# Patient Record
Sex: Female | Born: 1945 | Race: Black or African American | Hispanic: No | Marital: Married | Smoking: Never smoker
Health system: Southern US, Community
[De-identification: ages and names within clinical notes are randomized; demographics above are authoritative.]

## PROBLEM LIST (undated history)

## (undated) DIAGNOSIS — Z8719 Personal history of other diseases of the digestive system: Secondary | ICD-10-CM

## (undated) DIAGNOSIS — Z87448 Personal history of other diseases of urinary system: Secondary | ICD-10-CM

## (undated) DIAGNOSIS — Z8619 Personal history of other infectious and parasitic diseases: Secondary | ICD-10-CM

## (undated) DIAGNOSIS — J45909 Unspecified asthma, uncomplicated: Secondary | ICD-10-CM

## (undated) DIAGNOSIS — K219 Gastro-esophageal reflux disease without esophagitis: Secondary | ICD-10-CM

## (undated) DIAGNOSIS — Z860100 Personal history of colon polyps, unspecified: Secondary | ICD-10-CM

## (undated) DIAGNOSIS — R933 Abnormal findings on diagnostic imaging of other parts of digestive tract: Secondary | ICD-10-CM

## (undated) DIAGNOSIS — T8859XA Other complications of anesthesia, initial encounter: Secondary | ICD-10-CM

## (undated) DIAGNOSIS — M1990 Unspecified osteoarthritis, unspecified site: Secondary | ICD-10-CM

## (undated) DIAGNOSIS — R079 Chest pain, unspecified: Secondary | ICD-10-CM

## (undated) DIAGNOSIS — E7800 Pure hypercholesterolemia, unspecified: Secondary | ICD-10-CM

## (undated) DIAGNOSIS — H25019 Cortical age-related cataract, unspecified eye: Secondary | ICD-10-CM

## (undated) DIAGNOSIS — R0600 Dyspnea, unspecified: Secondary | ICD-10-CM

## (undated) DIAGNOSIS — Z889 Allergy status to unspecified drugs, medicaments and biological substances status: Secondary | ICD-10-CM

## (undated) DIAGNOSIS — R112 Nausea with vomiting, unspecified: Secondary | ICD-10-CM

## (undated) DIAGNOSIS — Z9889 Other specified postprocedural states: Secondary | ICD-10-CM

## (undated) DIAGNOSIS — G40909 Epilepsy, unspecified, not intractable, without status epilepticus: Secondary | ICD-10-CM

## (undated) DIAGNOSIS — R55 Syncope and collapse: Secondary | ICD-10-CM

## (undated) DIAGNOSIS — I10 Essential (primary) hypertension: Secondary | ICD-10-CM

## (undated) HISTORY — PX: OTHER SURGICAL HISTORY: SHX169

## (undated) HISTORY — PX: COLONOSCOPY: SHX174

## (undated) HISTORY — PX: ESOPHAGOGASTRODUODENOSCOPY: SHX1529

## (undated) HISTORY — PX: ABDOMINAL HYSTERECTOMY: SHX81

## (undated) HISTORY — PX: APPENDECTOMY: SHX54

---

## 2005-01-11 ENCOUNTER — Other Ambulatory Visit: Payer: Self-pay

## 2005-01-11 ENCOUNTER — Emergency Department: Payer: Self-pay | Admitting: Internal Medicine

## 2005-01-11 ENCOUNTER — Ambulatory Visit: Payer: Self-pay | Admitting: Family Medicine

## 2006-02-02 ENCOUNTER — Ambulatory Visit: Payer: Self-pay | Admitting: Family Medicine

## 2006-06-04 ENCOUNTER — Emergency Department: Payer: Self-pay | Admitting: Emergency Medicine

## 2007-02-06 ENCOUNTER — Ambulatory Visit: Payer: Self-pay | Admitting: Unknown Physician Specialty

## 2008-02-20 ENCOUNTER — Ambulatory Visit: Payer: Self-pay | Admitting: Internal Medicine

## 2009-03-05 ENCOUNTER — Ambulatory Visit: Payer: Self-pay | Admitting: Internal Medicine

## 2010-05-24 ENCOUNTER — Ambulatory Visit: Payer: Self-pay | Admitting: Nurse Practitioner

## 2011-07-14 ENCOUNTER — Ambulatory Visit: Payer: Self-pay | Admitting: Nurse Practitioner

## 2011-09-09 ENCOUNTER — Emergency Department: Payer: Self-pay | Admitting: Emergency Medicine

## 2011-09-09 LAB — URINALYSIS, COMPLETE
Glucose,UR: NEGATIVE mg/dL (ref 0–75)
Nitrite: NEGATIVE
Ph: 6 (ref 4.5–8.0)
RBC,UR: 92 /HPF (ref 0–5)
Transitional Epi: 1
WBC UR: 146 /HPF (ref 0–5)

## 2011-09-09 LAB — CBC
HGB: 12.7 g/dL (ref 12.0–16.0)
MCH: 28.6 pg (ref 26.0–34.0)
MCHC: 32.5 g/dL (ref 32.0–36.0)
MCV: 88 fL (ref 80–100)
Platelet: 158 10*3/uL (ref 150–440)
RBC: 4.45 10*6/uL (ref 3.80–5.20)
RDW: 12.5 % (ref 11.5–14.5)
WBC: 4.5 10*3/uL (ref 3.6–11.0)

## 2011-09-09 LAB — COMPREHENSIVE METABOLIC PANEL
Anion Gap: 11 (ref 7–16)
BUN: 17 mg/dL (ref 7–18)
Calcium, Total: 8.7 mg/dL (ref 8.5–10.1)
Chloride: 107 mmol/L (ref 98–107)
Co2: 26 mmol/L (ref 21–32)
Creatinine: 0.78 mg/dL (ref 0.60–1.30)
EGFR (African American): >60
EGFR (Non-African Amer.): >60
Potassium: 3.5 mmol/L (ref 3.5–5.1)
SGOT(AST): 27 U/L (ref 15–37)
SGPT (ALT): 32 U/L
Total Protein: 7.6 g/dL (ref 6.4–8.2)

## 2012-06-16 ENCOUNTER — Emergency Department: Payer: Self-pay | Admitting: Emergency Medicine

## 2012-07-16 ENCOUNTER — Ambulatory Visit: Payer: Self-pay

## 2012-11-15 ENCOUNTER — Ambulatory Visit: Payer: Self-pay | Admitting: Unknown Physician Specialty

## 2013-01-16 ENCOUNTER — Ambulatory Visit: Payer: Self-pay | Admitting: General Practice

## 2013-02-05 ENCOUNTER — Ambulatory Visit: Payer: Self-pay | Admitting: General Practice

## 2013-02-05 LAB — CREATININE, SERUM: Creatinine: 0.72 mg/dL (ref 0.60–1.30)

## 2013-07-29 ENCOUNTER — Ambulatory Visit: Payer: Self-pay | Admitting: Unknown Physician Specialty

## 2013-08-28 ENCOUNTER — Ambulatory Visit: Payer: Self-pay | Admitting: Internal Medicine

## 2013-09-26 ENCOUNTER — Ambulatory Visit: Payer: Self-pay | Admitting: Unknown Physician Specialty

## 2013-10-01 LAB — PATHOLOGY REPORT

## 2014-04-02 ENCOUNTER — Ambulatory Visit: Payer: Self-pay | Admitting: Unknown Physician Specialty

## 2014-09-04 ENCOUNTER — Ambulatory Visit: Payer: Self-pay | Admitting: Internal Medicine

## 2015-06-08 ENCOUNTER — Other Ambulatory Visit: Payer: Self-pay | Admitting: Pediatrics

## 2015-06-08 DIAGNOSIS — Z1231 Encounter for screening mammogram for malignant neoplasm of breast: Secondary | ICD-10-CM

## 2015-09-07 ENCOUNTER — Ambulatory Visit
Admission: RE | Admit: 2015-09-07 | Discharge: 2015-09-07 | Disposition: A | Discharge status: Home / Self Care | Payer: Medicare Other | Source: Ambulatory Visit | Attending: Pediatrics | Admitting: Pediatrics

## 2015-09-07 ENCOUNTER — Other Ambulatory Visit: Payer: Self-pay | Admitting: Pediatrics

## 2015-09-07 DIAGNOSIS — Z1231 Encounter for screening mammogram for malignant neoplasm of breast: Secondary | ICD-10-CM

## 2016-07-12 ENCOUNTER — Other Ambulatory Visit: Payer: Self-pay | Admitting: Nurse Practitioner

## 2016-07-12 DIAGNOSIS — R079 Chest pain, unspecified: Secondary | ICD-10-CM

## 2016-07-18 ENCOUNTER — Other Ambulatory Visit: Payer: PPO

## 2016-07-18 ENCOUNTER — Ambulatory Visit: Payer: PPO

## 2016-07-21 ENCOUNTER — Ambulatory Visit
Admission: RE | Admit: 2016-07-21 | Discharge: 2016-07-21 | Disposition: A | Discharge status: Home / Self Care | Payer: Medicare Other | Source: Ambulatory Visit | Attending: Nurse Practitioner | Admitting: Nurse Practitioner

## 2016-07-21 DIAGNOSIS — R079 Chest pain, unspecified: Secondary | ICD-10-CM

## 2016-07-21 DIAGNOSIS — R0789 Other chest pain: Secondary | ICD-10-CM | POA: Insufficient documentation

## 2016-07-21 DIAGNOSIS — K449 Diaphragmatic hernia without obstruction or gangrene: Secondary | ICD-10-CM | POA: Insufficient documentation

## 2016-08-01 ENCOUNTER — Other Ambulatory Visit: Payer: Self-pay | Admitting: Family

## 2016-08-01 DIAGNOSIS — Z1231 Encounter for screening mammogram for malignant neoplasm of breast: Secondary | ICD-10-CM

## 2016-09-13 ENCOUNTER — Ambulatory Visit
Admission: RE | Admit: 2016-09-13 | Discharge: 2016-09-13 | Disposition: A | Discharge status: Home / Self Care | Payer: Medicare Other | Source: Ambulatory Visit | Attending: Family | Admitting: Family

## 2016-09-13 ENCOUNTER — Encounter: Payer: Self-pay | Admitting: Radiology

## 2016-09-13 DIAGNOSIS — Z1231 Encounter for screening mammogram for malignant neoplasm of breast: Secondary | ICD-10-CM | POA: Insufficient documentation

## 2017-08-25 ENCOUNTER — Other Ambulatory Visit: Payer: Self-pay | Admitting: Family Medicine

## 2017-09-05 ENCOUNTER — Other Ambulatory Visit: Payer: Self-pay | Admitting: Family

## 2017-09-05 DIAGNOSIS — Z1231 Encounter for screening mammogram for malignant neoplasm of breast: Secondary | ICD-10-CM

## 2017-09-20 ENCOUNTER — Ambulatory Visit
Admission: RE | Admit: 2017-09-20 | Discharge: 2017-09-20 | Disposition: A | Discharge status: Home / Self Care | Payer: Medicare Other | Source: Ambulatory Visit | Attending: Family | Admitting: Family

## 2017-09-20 DIAGNOSIS — Z1231 Encounter for screening mammogram for malignant neoplasm of breast: Secondary | ICD-10-CM | POA: Diagnosis not present

## 2017-11-06 ENCOUNTER — Other Ambulatory Visit: Payer: Self-pay | Admitting: Unknown Physician Specialty

## 2017-11-06 DIAGNOSIS — M1712 Unilateral primary osteoarthritis, left knee: Secondary | ICD-10-CM

## 2017-11-10 ENCOUNTER — Ambulatory Visit
Admission: RE | Admit: 2017-11-10 | Discharge: 2017-11-10 | Disposition: A | Discharge status: Home / Self Care | Payer: Medicare Other | Source: Ambulatory Visit | Attending: Unknown Physician Specialty | Admitting: Unknown Physician Specialty

## 2017-11-10 DIAGNOSIS — S83232A Complex tear of medial meniscus, current injury, left knee, initial encounter: Secondary | ICD-10-CM | POA: Diagnosis not present

## 2017-11-10 DIAGNOSIS — M948X6 Other specified disorders of cartilage, lower leg: Secondary | ICD-10-CM | POA: Insufficient documentation

## 2017-11-10 DIAGNOSIS — M1712 Unilateral primary osteoarthritis, left knee: Secondary | ICD-10-CM | POA: Insufficient documentation

## 2017-11-10 DIAGNOSIS — X58XXXA Exposure to other specified factors, initial encounter: Secondary | ICD-10-CM | POA: Insufficient documentation

## 2018-03-26 HISTORY — PX: JOINT REPLACEMENT: SHX530

## 2018-06-22 ENCOUNTER — Encounter: Payer: Self-pay | Admitting: *Deleted

## 2018-06-25 ENCOUNTER — Encounter
Admission: RE | Disposition: A | Discharge status: Home / Self Care | Payer: Self-pay | Source: Home / Self Care | Attending: Unknown Physician Specialty

## 2018-06-25 ENCOUNTER — Ambulatory Visit: Payer: Medicare Other | Admitting: Certified Registered Nurse Anesthetist

## 2018-06-25 ENCOUNTER — Encounter: Payer: Self-pay | Admitting: *Deleted

## 2018-06-25 ENCOUNTER — Ambulatory Visit
Admission: RE | Admit: 2018-06-25 | Discharge: 2018-06-25 | Disposition: A | Discharge status: Home / Self Care | Payer: Medicare Other | Attending: Unknown Physician Specialty | Admitting: Unknown Physician Specialty

## 2018-06-25 ENCOUNTER — Other Ambulatory Visit: Payer: Self-pay

## 2018-06-25 DIAGNOSIS — Z1211 Encounter for screening for malignant neoplasm of colon: Secondary | ICD-10-CM | POA: Diagnosis not present

## 2018-06-25 DIAGNOSIS — Z79899 Other long term (current) drug therapy: Secondary | ICD-10-CM | POA: Diagnosis not present

## 2018-06-25 DIAGNOSIS — Z86010 Personal history of colonic polyps: Secondary | ICD-10-CM | POA: Insufficient documentation

## 2018-06-25 DIAGNOSIS — R12 Heartburn: Secondary | ICD-10-CM | POA: Diagnosis not present

## 2018-06-25 DIAGNOSIS — I10 Essential (primary) hypertension: Secondary | ICD-10-CM | POA: Diagnosis not present

## 2018-06-25 DIAGNOSIS — K2970 Gastritis, unspecified, without bleeding: Secondary | ICD-10-CM | POA: Insufficient documentation

## 2018-06-25 DIAGNOSIS — Z7982 Long term (current) use of aspirin: Secondary | ICD-10-CM | POA: Diagnosis not present

## 2018-06-25 DIAGNOSIS — J45909 Unspecified asthma, uncomplicated: Secondary | ICD-10-CM | POA: Diagnosis not present

## 2018-06-25 DIAGNOSIS — K640 First degree hemorrhoids: Secondary | ICD-10-CM | POA: Diagnosis not present

## 2018-06-25 DIAGNOSIS — E7800 Pure hypercholesterolemia, unspecified: Secondary | ICD-10-CM | POA: Insufficient documentation

## 2018-06-25 DIAGNOSIS — D120 Benign neoplasm of cecum: Secondary | ICD-10-CM | POA: Diagnosis not present

## 2018-06-25 DIAGNOSIS — K449 Diaphragmatic hernia without obstruction or gangrene: Secondary | ICD-10-CM | POA: Diagnosis not present

## 2018-06-25 HISTORY — DX: Personal history of colon polyps, unspecified: Z86.0100

## 2018-06-25 HISTORY — DX: Personal history of other infectious and parasitic diseases: Z86.19

## 2018-06-25 HISTORY — DX: Pure hypercholesterolemia, unspecified: E78.00

## 2018-06-25 HISTORY — DX: Personal history of other diseases of the digestive system: Z87.19

## 2018-06-25 HISTORY — DX: Personal history of colonic polyps: Z86.010

## 2018-06-25 HISTORY — DX: Allergy status to unspecified drugs, medicaments and biological substances: Z88.9

## 2018-06-25 HISTORY — DX: Chest pain, unspecified: R07.9

## 2018-06-25 HISTORY — DX: Syncope and collapse: R55

## 2018-06-25 HISTORY — DX: Abnormal findings on diagnostic imaging of other parts of digestive tract: R93.3

## 2018-06-25 HISTORY — DX: Epilepsy, unspecified, not intractable, without status epilepticus: G40.909

## 2018-06-25 HISTORY — PX: ESOPHAGOGASTRODUODENOSCOPY (EGD) WITH PROPOFOL: SHX5813

## 2018-06-25 HISTORY — PX: COLONOSCOPY WITH PROPOFOL: SHX5780

## 2018-06-25 HISTORY — DX: Unspecified asthma, uncomplicated: J45.909

## 2018-06-25 HISTORY — DX: Personal history of other diseases of urinary system: Z87.448

## 2018-06-25 HISTORY — DX: Essential (primary) hypertension: I10

## 2018-06-25 HISTORY — DX: Cortical age-related cataract, unspecified eye: H25.019

## 2018-06-25 HISTORY — DX: Unspecified osteoarthritis, unspecified site: M19.90

## 2018-06-25 SURGERY — COLONOSCOPY WITH PROPOFOL
Anesthesia: General

## 2018-06-25 MED ORDER — PIPERACILLIN-TAZOBACTAM 3.375 G IVPB 30 MIN
3.3750 g | Freq: Once | INTRAVENOUS | Status: AC
  Administered 2018-06-25: 3.375 g via INTRAVENOUS
  Filled 2018-06-25: qty 50

## 2018-06-25 MED ORDER — GLYCOPYRROLATE 0.2 MG/ML IJ SOLN
INTRAMUSCULAR | Status: DC | PRN
  Administered 2018-06-25: 0.2 mg via INTRAVENOUS

## 2018-06-25 MED ORDER — LIDOCAINE HCL (PF) 2 % IJ SOLN
INTRAMUSCULAR | Status: AC
  Filled 2018-06-25: qty 10

## 2018-06-25 MED ORDER — PIPERACILLIN-TAZOBACTAM 3.375 G IVPB
INTRAVENOUS | Status: AC
  Administered 2018-06-25: 3.375 g via INTRAVENOUS
  Filled 2018-06-25: qty 50

## 2018-06-25 MED ORDER — SODIUM CHLORIDE 0.9 % IV SOLN
INTRAVENOUS | Status: DC
  Administered 2018-06-25: 12:00:00 via INTRAVENOUS

## 2018-06-25 MED ORDER — LIDOCAINE HCL (CARDIAC) PF 100 MG/5ML IV SOSY
PREFILLED_SYRINGE | INTRAVENOUS | Status: DC | PRN
  Administered 2018-06-25: 100 mg via INTRATRACHEAL

## 2018-06-25 MED ORDER — PROPOFOL 500 MG/50ML IV EMUL
INTRAVENOUS | Status: DC | PRN
  Administered 2018-06-25: 80 ug/kg/min via INTRAVENOUS

## 2018-06-25 MED ORDER — GLYCOPYRROLATE 0.2 MG/ML IJ SOLN
INTRAMUSCULAR | Status: AC
  Filled 2018-06-25: qty 1

## 2018-06-25 MED ORDER — PROPOFOL 500 MG/50ML IV EMUL
INTRAVENOUS | Status: AC
  Filled 2018-06-25: qty 50

## 2018-06-25 MED ORDER — PROPOFOL 10 MG/ML IV BOLUS
INTRAVENOUS | Status: DC | PRN
  Administered 2018-06-25: 80 mg via INTRAVENOUS

## 2018-06-25 NOTE — Anesthesia Preprocedure Evaluation (Signed)
Anesthesia Evaluation  Patient identified by MRN, date of birth, ID band Patient awake    Reviewed: Allergy & Precautions, H&P , NPO status , Patient's Chart, lab work & pertinent test results, reviewed documented beta blocker date and time   History of Anesthesia Complications Negative for: history of anesthetic complications  Airway Mallampati: III  TM Distance: >3 FB Neck ROM: full    Dental  (+) Partial Upper, Dental Advidsory Given, Chipped   Pulmonary neg shortness of breath, asthma , neg sleep apnea, neg COPD, neg recent URI,           Cardiovascular Exercise Tolerance: Good hypertension, (-) angina(-) CAD, (-) Past MI, (-) Cardiac Stents and (-) CABG Normal cardiovascular exam(-) dysrhythmias (-) Valvular Problems/Murmurs Rhythm:regular Rate:Normal     Neuro/Psych Seizures -, Well Controlled,  negative psych ROS   GI/Hepatic Neg liver ROS, hiatal hernia, GERD  ,  Endo/Other  negative endocrine ROS  Renal/GU negative Renal ROS  negative genitourinary   Musculoskeletal   Abdominal   Peds  Hematology negative hematology ROS (+)   Anesthesia Other Findings Past Medical History: No date: Abnormal barium swallow No date: Allergic genetic state No date: Arthritis     Comment:  OSTEOARTHRITIS No date: Asthma No date: Cataract cortical, senile No date: Chest pain No date: Epilepsy ([place]) No date: History of colon polyps No date: History of Helicobacter pylori infection No date: History of hematuria No date: History of hiatal hernia No date: Hypercholesterolemia No date: Hypertension No date: Syncopal episodes   Reproductive/Obstetrics negative OB ROS                             Anesthesia Physical Anesthesia Plan  ASA: III  Anesthesia Plan: General   Post-op Pain Management:    Induction: Intravenous  PONV Risk Score and Plan: 3 and Propofol infusion and TIVA  Airway  Management Planned: Natural Airway and Nasal Cannula  Additional Equipment:   Intra-op Plan:   Post-operative Plan:   Informed Consent: I have reviewed the patients History and Physical, chart, labs and discussed the procedure including the risks, benefits and alternatives for the proposed anesthesia with the patient or authorized representative who has indicated his/her understanding and acceptance.   Dental Advisory Given  Plan Discussed with: Anesthesiologist, CRNA and Surgeon  Anesthesia Plan Comments:         Anesthesia Quick Evaluation

## 2018-06-25 NOTE — H&P (Signed)
Primary Care Physician:  Juanell Fairly, MD Primary Gastroenterologist:  Dr. Vira Agar  Pre-Procedure History & Physical: HPI:  Hannah Davila is a 72 y.o. female is here for an endoscopy and colonoscopy.   Past Medical History:  Diagnosis Date  . Abnormal barium swallow   . Allergic genetic state   . Arthritis    OSTEOARTHRITIS  . Asthma   . Cataract cortical, senile   . Chest pain   . Epilepsy ([place])   . History of colon polyps   . History of Helicobacter pylori infection   . History of hematuria   . History of hiatal hernia   . Hypercholesterolemia   . Hypertension   . Syncopal episodes     Past Surgical History:  Procedure Laterality Date  . ABDOMINAL HYSTERECTOMY    . COLONOSCOPY    . ESOPHAGOGASTRODUODENOSCOPY    . JOINT REPLACEMENT Left 03/26/2018  . REMOVAL WISDOM TEETH    . RT.ARM SURGERY     [facility] [place]    Prior to Admission medications   Medication Sig Start Date End Date Taking? Authorizing Provider  acetaminophen (TYLENOL) 500 MG tablet Take 500 mg by mouth every 6 (six) hours as needed.   Yes [provider]  albuterol (PROVENTIL HFA;VENTOLIN HFA) 108 (90 Base) MCG/ACT inhaler Inhale 2 puffs into the lungs every 6 (six) hours as needed for wheezing or shortness of breath.   Yes [provider]  aspirin EC 81 MG tablet Take 81 mg by mouth daily.   Yes [provider]  lisinopril (PRINIVIL,ZESTRIL) 20 MG tablet Take 20 mg by mouth daily.   Yes [provider]  metoprolol succinate (TOPROL-XL) 50 MG 24 hr tablet Take 50 mg by mouth daily. Take with or immediately following a meal.   Yes [provider]  Multiple Vitamin (MULTIVITAMIN) tablet Take 1 tablet by mouth daily.   Yes [provider]  omeprazole (PRILOSEC) 20 MG capsule Take 20 mg by mouth daily.   Yes [provider]  pravastatin (PRAVACHOL) 40 MG tablet Take 40 mg by mouth daily.   Yes [provider]     Allergies as of 04/25/2018  . (Not on File)    Family History  Problem Relation Age of Onset  . Breast cancer Sister     Social History   Socioeconomic History  . Marital status: Married    Spouse name: Not on file  . Number of children: Not on file  . Years of education: Not on file  . Highest education level: Not on file  Occupational History  . Not on file  Social Needs  . Financial resource strain: Not on file  . Food insecurity:    Worry: Not on file    Inability: Not on file  . Transportation needs:    Medical: Not on file    Non-medical: Not on file  Tobacco Use  . Smoking status: Never Smoker  . Smokeless tobacco: Never Used  Substance and Sexual Activity  . Alcohol use: Never    Frequency: Never  . Drug use: Never  . Sexual activity: Not on file  Lifestyle  . Physical activity:    Days per week: Not on file    Minutes per session: Not on file  . Stress: Not on file  Relationships  . Social connections:    Talks on phone: Not on file    Gets together: Not on file    Attends religious service: Not  on file    Active member of club or organization: Not on file    Attends meetings of clubs or organizations: Not on file    Relationship status: Not on file  . Intimate partner violence:    Fear of current or ex partner: Not on file    Emotionally abused: Not on file    Physically abused: Not on file    Forced sexual activity: Not on file  Other Topics Concern  . Not on file  Social History Narrative  . Not on file    Review of Systems: See HPI, otherwise negative ROS  Physical Exam: BP (!) 151/76   Pulse (!) 10   Temp 97.7 F (36.5 C) (Tympanic)   Resp 18   Ht 5\' 2"  (1.575 m)   Wt 83 kg   SpO2 100%   BMI 33.47 kg/m  General:   Alert,  pleasant and cooperative in NAD Head:  Normocephalic and atraumatic. Neck:  Supple; no masses or thyromegaly. Lungs:  Clear throughout to auscultation.    Heart:  Regular rate and rhythm. Abdomen:   Soft, nontender and nondistended. Normal bowel sounds, without guarding, and without rebound.   Neurologic:  Alert and  oriented x4;  grossly normal neurologically.  Impression/Plan: Hannah Davila is here for an endoscopy and colonoscopy to be performed for [facility] colon polyps and GERD   Risks, benefits, limitations, and alternatives regarding  endoscopy and colonoscopy   have been reviewed with the patient.  Questions have been answered.  All parties agreeable.   Gaylyn Cheers, MD  06/25/2018, 12:42 PM

## 2018-06-25 NOTE — Anesthesia Post-op Follow-up Note (Signed)
Anesthesia QCDR form completed.        

## 2018-06-25 NOTE — Transfer of Care (Signed)
Immediate Anesthesia Transfer of Care Note  Patient: Hannah Davila  Procedure(s) Performed: COLONOSCOPY WITH PROPOFOL (N/A ) ESOPHAGOGASTRODUODENOSCOPY (EGD) WITH PROPOFOL (N/A )  Patient Location: PACU and Endoscopy Unit  Anesthesia Type:General  Level of Consciousness: sedated  Airway & Oxygen Therapy: Patient Spontanous Breathing and Patient connected to nasal cannula oxygen  Post-op Assessment: Report given to RN and Post -op Vital signs reviewed and stable  Post vital signs: Reviewed and stable  Last Vitals:  Vitals Value Taken Time  BP 123/74 06/25/2018  1:34 PM  Temp 36.2 C 06/25/2018  1:34 PM  Pulse 81 06/25/2018  1:34 PM  Resp 21 06/25/2018  1:34 PM  SpO2 100 % 06/25/2018  1:34 PM  Vitals shown include unvalidated device data.  Last Pain:  Vitals:   06/25/18 1334  TempSrc:   PainSc: Asleep         Complications: No apparent anesthesia complications

## 2018-06-25 NOTE — Op Note (Signed)
[facility] Gastroenterology Patient Name: Rubena Roseman Procedure Date: 06/25/2018 12:47 PM MRN: [id number] Account #: [id number] Date of Birth: 1946-04-11 Admit Type: Outpatient Age: 72 Room: [facility] ENDO ROOM 1 Gender: Female Note Status: Finalized Procedure:            Upper GI endoscopy Indications:          Heartburn Providers:            Manya Silvas, MD Referring MD:         No Local Md, MD (Referring MD) Medicines:            Propofol per Anesthesia Complications:        No immediate complications. Procedure:            Pre-Anesthesia Assessment:                       - After reviewing the risks and benefits, the patient                        was deemed in satisfactory condition to undergo the                        procedure.                       After obtaining informed consent, the endoscope was                        passed under direct vision. Throughout the procedure,                        the patient's blood pressure, pulse, and oxygen                        saturations were monitored continuously. The Endoscope                        was introduced through the mouth, and advanced to the                        second part of duodenum. The upper GI endoscopy was                        accomplished without difficulty. The patient tolerated                        the procedure well. Findings:      A medium-sized hiatal hernia was present. GEJ 36cm.      Patchy mildly erythematous mucosa without bleeding was found in the       stomach. Biopsies were taken with a cold forceps for histology.      The examined duodenum was normal. Impression:           - Medium-sized hiatal hernia.                       - Erythematous mucosa in the stomach. Biopsied.                       - Normal examined duodenum. Recommendation:       - Await pathology results. Manya Silvas, MD 06/25/2018  1:00:56 PM This report has been signed electronically. Number of  Addenda: 0 Note Initiated On: 06/25/2018 12:47 PM      [facility]

## 2018-06-25 NOTE — Op Note (Signed)
[facility] Gastroenterology Patient Name: Gelena Klosinski Procedure Date: 06/25/2018 12:46 PM MRN: [id number] Account #: [id number] Date of Birth: [birth year] Admit Type: Outpatient Age: 72 Room: [facility] ENDO ROOM 1 Gender: Female Note Status: Finalized Procedure:            Colonoscopy Indications:          High risk colon cancer surveillance: Personal history                        of colonic polyps Providers:            Manya Silvas, MD Referring MD:         No Local Md, MD (Referring MD) Medicines:            Propofol per Anesthesia Complications:        No immediate complications. Procedure:            Pre-Anesthesia Assessment:                       - After reviewing the risks and benefits, the patient                        was deemed in satisfactory condition to undergo the                        procedure.                       After obtaining informed consent, the colonoscope was                        passed under direct vision. Throughout the procedure,                        the patient's blood pressure, pulse, and oxygen                        saturations were monitored continuously. The                        Colonoscope was introduced through the anus and                        advanced to the the cecum, identified by appendiceal                        orifice and ileocecal valve. The colonoscopy was                        performed without difficulty. The patient tolerated the                        procedure well. The quality of the bowel preparation                        was good. Findings:      A diminutive polyp was found in the cecum. The polyp was sessile. The       polyp was removed with a jumbo cold forceps. Resection and retrieval       were complete.      A small polyp was found  in the cecum. The polyp was sessile. The polyp       was removed with a cold snare. Resection and retrieval were complete.      Internal hemorrhoids were  found during endoscopy. The hemorrhoids were       small and Grade I (internal hemorrhoids that do not prolapse).      The exam was otherwise without abnormality. Impression:           - One diminutive polyp in the cecum, removed with a                        jumbo cold forceps. Resected and retrieved.                       - One small polyp in the cecum, removed with a cold                        snare. Resected and retrieved.                       - Internal hemorrhoids.                       - The examination was otherwise normal. Recommendation:       - Await pathology results. Manya Silvas, MD 06/25/2018 1:32:55 PM This report has been signed electronically. Number of Addenda: 0 Note Initiated On: 06/25/2018 12:46 PM Scope Withdrawal Time: 0 hours 15 minutes 38 seconds  Total Procedure Duration: 0 hours 26 minutes 23 seconds       [facility]

## 2018-06-26 ENCOUNTER — Encounter: Payer: Self-pay | Admitting: Unknown Physician Specialty

## 2018-06-26 LAB — SURGICAL PATHOLOGY

## 2018-06-29 NOTE — Anesthesia Postprocedure Evaluation (Signed)
Anesthesia Post Note  Patient: Hannah Davila  Procedure(s) Performed: COLONOSCOPY WITH PROPOFOL (N/A ) ESOPHAGOGASTRODUODENOSCOPY (EGD) WITH PROPOFOL (N/A )  Patient location during evaluation: Endoscopy Anesthesia Type: General Level of consciousness: awake and alert Pain management: pain level controlled Vital Signs Assessment: post-procedure vital signs reviewed and stable Respiratory status: spontaneous breathing, nonlabored ventilation, respiratory function stable and patient connected to nasal cannula oxygen Cardiovascular status: blood pressure returned to baseline and stable Postop Assessment: no apparent nausea or vomiting Anesthetic complications: no     Last Vitals:  Vitals:   06/25/18 1345 06/25/18 1354  BP: 126/68 125/89  Pulse: 72 72  Resp: 16 15  Temp:    SpO2: 100% 100%    Last Pain:  Vitals:   06/26/18 0758  TempSrc:   PainSc: 0-No pain                 Martha Clan

## 2018-08-15 ENCOUNTER — Other Ambulatory Visit: Payer: Self-pay | Admitting: Family

## 2018-08-15 DIAGNOSIS — Z1231 Encounter for screening mammogram for malignant neoplasm of breast: Secondary | ICD-10-CM

## 2018-12-25 ENCOUNTER — Other Ambulatory Visit: Payer: Self-pay

## 2018-12-25 ENCOUNTER — Ambulatory Visit
Admission: RE | Admit: 2018-12-25 | Discharge: 2018-12-25 | Disposition: A | Discharge status: Home / Self Care | Payer: Medicare Other | Source: Ambulatory Visit | Attending: Family | Admitting: Family

## 2018-12-25 DIAGNOSIS — Z1231 Encounter for screening mammogram for malignant neoplasm of breast: Secondary | ICD-10-CM | POA: Insufficient documentation

## 2019-07-09 ENCOUNTER — Other Ambulatory Visit: Payer: Self-pay | Admitting: Family

## 2019-07-09 DIAGNOSIS — Z1231 Encounter for screening mammogram for malignant neoplasm of breast: Secondary | ICD-10-CM

## 2019-12-26 ENCOUNTER — Ambulatory Visit
Admission: RE | Admit: 2019-12-26 | Discharge: 2019-12-26 | Disposition: A | Discharge status: Home / Self Care | Payer: Medicare PPO | Source: Ambulatory Visit | Attending: Family | Admitting: Family

## 2019-12-26 DIAGNOSIS — Z1231 Encounter for screening mammogram for malignant neoplasm of breast: Secondary | ICD-10-CM | POA: Insufficient documentation

## 2020-06-11 ENCOUNTER — Other Ambulatory Visit: Payer: Self-pay | Admitting: Surgery

## 2020-06-23 ENCOUNTER — Other Ambulatory Visit: Payer: Self-pay

## 2020-06-23 ENCOUNTER — Encounter
Admission: RE | Admit: 2020-06-23 | Discharge: 2020-06-23 | Disposition: A | Discharge status: Home / Self Care | Payer: Medicare PPO | Source: Ambulatory Visit | Attending: Surgery | Admitting: Surgery

## 2020-06-23 DIAGNOSIS — Z01818 Encounter for other preprocedural examination: Secondary | ICD-10-CM | POA: Insufficient documentation

## 2020-06-23 HISTORY — DX: Other specified postprocedural states: Z98.890

## 2020-06-23 HISTORY — DX: Other specified postprocedural states: R11.2

## 2020-06-23 HISTORY — DX: Other complications of anesthesia, initial encounter: T88.59XA

## 2020-06-23 HISTORY — DX: Dyspnea, unspecified: R06.00

## 2020-06-23 HISTORY — DX: Gastro-esophageal reflux disease without esophagitis: K21.9

## 2020-06-23 LAB — URINALYSIS, ROUTINE W REFLEX MICROSCOPIC
Bilirubin Urine: NEGATIVE
Glucose, UA: NEGATIVE mg/dL
Hgb urine dipstick: NEGATIVE
Ketones, ur: NEGATIVE mg/dL
Leukocytes,Ua: NEGATIVE
Nitrite: NEGATIVE
Protein, ur: NEGATIVE mg/dL
Specific Gravity, Urine: 1.018 (ref 1.005–1.030)
pH: 6 (ref 5.0–8.0)

## 2020-06-23 LAB — CBC WITH DIFFERENTIAL/PLATELET
Abs Immature Granulocytes: 0.02 10*3/uL (ref 0.00–0.07)
Basophils Absolute: 0 10*3/uL (ref 0.0–0.1)
Basophils Relative: 1 %
Eosinophils Absolute: 0.1 10*3/uL (ref 0.0–0.5)
Eosinophils Relative: 1 %
HCT: 37.8 % (ref 36.0–46.0)
Hemoglobin: 12 g/dL (ref 12.0–15.0)
Immature Granulocytes: 1 %
Lymphocytes Relative: 39 %
Lymphs Abs: 1.7 10*3/uL (ref 0.7–4.0)
MCH: 28.7 pg (ref 26.0–34.0)
MCHC: 31.7 g/dL (ref 30.0–36.0)
MCV: 90.4 fL (ref 80.0–100.0)
Monocytes Absolute: 0.3 10*3/uL (ref 0.1–1.0)
Monocytes Relative: 7 %
Neutro Abs: 2.3 10*3/uL (ref 1.7–7.7)
Neutrophils Relative %: 51 %
Platelets: 164 10*3/uL (ref 150–400)
RBC: 4.18 MIL/uL (ref 3.87–5.11)
RDW: 12.2 % (ref 11.5–15.5)
WBC: 4.4 10*3/uL (ref 4.0–10.5)
nRBC: 0 % (ref 0.0–0.2)

## 2020-06-23 LAB — COMPREHENSIVE METABOLIC PANEL
ALT: 21 U/L (ref 0–44)
AST: 21 U/L (ref 15–41)
Albumin: 4 g/dL (ref 3.5–5.0)
Alkaline Phosphatase: 83 U/L (ref 38–126)
Anion gap: 8 (ref 5–15)
BUN: 18 mg/dL (ref 8–23)
CO2: 27 mmol/L (ref 22–32)
Calcium: 8.7 mg/dL — ABNORMAL LOW (ref 8.9–10.3)
Chloride: 106 mmol/L (ref 98–111)
Creatinine, Ser: 0.74 mg/dL (ref 0.44–1.00)
GFR, Estimated: >60 mL/min (ref >60)
Glucose, Bld: 118 mg/dL — ABNORMAL HIGH (ref 70–99)
Potassium: 3.7 mmol/L (ref 3.5–5.1)
Sodium: 141 mmol/L (ref 135–145)
Total Bilirubin: 0.7 mg/dL (ref 0.3–1.2)
Total Protein: 7 g/dL (ref 6.5–8.1)

## 2020-06-23 LAB — TYPE AND SCREEN
ABO/RH(D): A POS
Antibody Screen: NEGATIVE

## 2020-06-23 LAB — SURGICAL PCR SCREEN
MRSA, PCR: NEGATIVE
Staphylococcus aureus: NEGATIVE

## 2020-06-23 NOTE — Patient Instructions (Signed)
Your procedure is scheduled on: Thursday July 02, 2020. Report to Day Surgery inside [facility] 2nd floor (stop by Registration desk first floor). To find out your arrival time please call [phone] between 1PM - 3PM on Wednesday July 01, 2020.  Remember: Instructions that are not followed completely may result in serious medical risk,  up to and including death, or upon the discretion of your surgeon and anesthesiologist your  surgery may need to be rescheduled.     _X__ 1. Do not eat food after midnight the night before your procedure.                 No chewing gum or hard candies. You may drink clear liquids up to 2 hours                 before you are scheduled to arrive for your surgery- DO not drink clear                 liquids within 2 hours of the start of your surgery.                 Clear Liquids include:  water, apple juice without pulp, clear Gatorade, G2 or                  Gatorade Zero (avoid Red/Purple/Blue), Black Coffee or Tea (Do not add                 anything to coffee or tea).  __X__2.   Complete the "Ensure Clear Pre-surgery Clear Carbohydrate Drink" provided to you, 2 hours before arrival. **If you are diabetic you will be provided with an alternative drink, Gatorade Zero or G2.  __X__3.  On the morning of surgery brush your teeth with toothpaste and water, you                may rinse your mouth with mouthwash if you wish.  Do not swallow any toothpaste of mouthwash.     _X__ 4.  No Alcohol for 24 hours before or after surgery.   _X__ 5.  Do Not Smoke or use e-cigarettes For 24 Hours Prior to Your Surgery.                 Do not use any chewable tobacco products for at least 6 hours prior to                 Surgery.  _X__  6.  Do not use any recreational drugs (marijuana, cocaine, heroin, ecstasy, MDMA or other)                For at least one week prior to your surgery.  Combination of these drugs with anesthesia                 May have life threatening results.  __X__  7.  Notify your doctor if there is any change in your medical condition      (cold, fever, infections).     Do not wear jewelry, make-up, hairpins, clips or nail polish. Do not wear lotions, powders, or perfumes. You may wear deodorant. Do not shave 48 hours prior to surgery. Men may shave face and neck. Do not bring valuables to the hospital.    [facility] is not responsible for any belongings or valuables.  Contacts, dentures or bridgework may not be worn into surgery. Leave your suitcase in the car. After surgery it may  be brought to your room. For patients admitted to the hospital, discharge time is determined by your treatment team.   Patients discharged the day of surgery will not be allowed to drive home.   Make arrangements for someone to be with you for the first 24 hours of your Same Day Discharge.    Please read over the following fact sheets that you were given:  Joint replacement book  Incentive Spirometry   __X__ Take these medicines the morning of surgery with A SIP OF WATER:    1. amLODipine (NORVASC) 5 MG   2. metoprolol tartrate (LOPRESSOR) 50 MG   3. omeprazole (PRILOSEC) 20 MG capsule  4. pravastatin (PRAVACHOL) 40 MG    ____ Fleet Enema (as directed)   __X__ Use CHG Soap (or wipes) as directed  ____ Use Benzoyl Peroxide Gel as instructed  __X__ Use inhalers on the day of surgery  albuterol (PROVENTIL HFA;VENTOLIN HFA) 108 (90 Base) MCG/ACT inhaler  ____ Stop metformin 2 days prior to surgery    ____ Take 1/2 of usual insulin dose the night before surgery. No insulin the morning          of surgery.   __X__ Stop aspirin as instructed by your doctor.   __X__ Stop Anti-inflammatories such as naproxen (NAPROSYN), Ibuprofen, Aleve, Advil and or BC powders.   __X__ Stop supplements until after surgery. Biotin w/ Vitamins C & E (HAIR/SKIN/NAILS PO)  __X__ Do not start any herbal supplements before your  procedure.    If you have any questions regarding your pre-procedure instructions,  Please call Pre-admit Testing at [phone].

## 2020-06-30 ENCOUNTER — Other Ambulatory Visit: Payer: Self-pay

## 2020-06-30 ENCOUNTER — Other Ambulatory Visit
Admission: RE | Admit: 2020-06-30 | Discharge: 2020-06-30 | Disposition: A | Discharge status: Home / Self Care | Payer: Medicare PPO | Source: Ambulatory Visit | Attending: Surgery | Admitting: Surgery

## 2020-06-30 DIAGNOSIS — Z20822 Contact with and (suspected) exposure to covid-19: Secondary | ICD-10-CM | POA: Insufficient documentation

## 2020-06-30 DIAGNOSIS — Z01812 Encounter for preprocedural laboratory examination: Secondary | ICD-10-CM | POA: Insufficient documentation

## 2020-06-30 LAB — SARS CORONAVIRUS 2 (TAT 6-24 HRS): SARS Coronavirus 2: NEGATIVE

## 2020-07-02 ENCOUNTER — Encounter: Payer: Self-pay | Admitting: Surgery

## 2020-07-02 ENCOUNTER — Other Ambulatory Visit: Payer: Self-pay

## 2020-07-02 ENCOUNTER — Inpatient Hospital Stay: Payer: Medicare PPO | Admitting: Anesthesiology

## 2020-07-02 ENCOUNTER — Inpatient Hospital Stay: Payer: Medicare PPO

## 2020-07-02 ENCOUNTER — Encounter
Admission: RE | Disposition: A | Discharge status: Home Health | Payer: Self-pay | Source: Home / Self Care | Attending: Surgery

## 2020-07-02 ENCOUNTER — Inpatient Hospital Stay
Admission: RE | Admit: 2020-07-02 | Discharge: 2020-07-03 | DRG: 470 | Disposition: A | Discharge status: Home Health | Payer: Medicare PPO | Attending: Surgery | Admitting: Surgery

## 2020-07-02 DIAGNOSIS — Z20822 Contact with and (suspected) exposure to covid-19: Secondary | ICD-10-CM | POA: Diagnosis present

## 2020-07-02 DIAGNOSIS — I10 Essential (primary) hypertension: Secondary | ICD-10-CM | POA: Diagnosis present

## 2020-07-02 DIAGNOSIS — Z7982 Long term (current) use of aspirin: Secondary | ICD-10-CM | POA: Diagnosis not present

## 2020-07-02 DIAGNOSIS — M1711 Unilateral primary osteoarthritis, right knee: Secondary | ICD-10-CM | POA: Diagnosis present

## 2020-07-02 DIAGNOSIS — J45909 Unspecified asthma, uncomplicated: Secondary | ICD-10-CM | POA: Diagnosis present

## 2020-07-02 DIAGNOSIS — Z96651 Presence of right artificial knee joint: Secondary | ICD-10-CM

## 2020-07-02 DIAGNOSIS — E785 Hyperlipidemia, unspecified: Secondary | ICD-10-CM | POA: Diagnosis present

## 2020-07-02 DIAGNOSIS — Z8249 Family history of ischemic heart disease and other diseases of the circulatory system: Secondary | ICD-10-CM | POA: Diagnosis not present

## 2020-07-02 DIAGNOSIS — Z79899 Other long term (current) drug therapy: Secondary | ICD-10-CM

## 2020-07-02 DIAGNOSIS — Z96652 Presence of left artificial knee joint: Secondary | ICD-10-CM | POA: Diagnosis present

## 2020-07-02 DIAGNOSIS — K219 Gastro-esophageal reflux disease without esophagitis: Secondary | ICD-10-CM | POA: Diagnosis present

## 2020-07-02 DIAGNOSIS — M170 Bilateral primary osteoarthritis of knee: Secondary | ICD-10-CM | POA: Diagnosis present

## 2020-07-02 DIAGNOSIS — G40909 Epilepsy, unspecified, not intractable, without status epilepticus: Secondary | ICD-10-CM | POA: Diagnosis present

## 2020-07-02 DIAGNOSIS — E7800 Pure hypercholesterolemia, unspecified: Secondary | ICD-10-CM | POA: Diagnosis present

## 2020-07-02 HISTORY — PX: TOTAL KNEE ARTHROPLASTY: SHX125

## 2020-07-02 LAB — ABO/RH: ABO/RH(D): A POS

## 2020-07-02 SURGERY — ARTHROPLASTY, KNEE, TOTAL
Anesthesia: Spinal | Site: Knee | Laterality: Right

## 2020-07-02 MED ORDER — ACETAMINOPHEN 10 MG/ML IV SOLN
INTRAVENOUS | Status: AC
  Filled 2020-07-02: qty 100

## 2020-07-02 MED ORDER — KETOROLAC TROMETHAMINE 15 MG/ML IJ SOLN
7.5000 mg | Freq: Four times a day (QID) | INTRAMUSCULAR | Status: AC
  Administered 2020-07-02: 7.5 mg via INTRAVENOUS

## 2020-07-02 MED ORDER — BUPIVACAINE LIPOSOME 1.3 % IJ SUSP
INTRAMUSCULAR | Status: AC
  Filled 2020-07-02: qty 20

## 2020-07-02 MED ORDER — METOPROLOL TARTRATE 50 MG PO TABS
ORAL_TABLET | ORAL | Status: AC
  Filled 2020-07-02: qty 1

## 2020-07-02 MED ORDER — TRAMADOL HCL 50 MG PO TABS
50.0000 mg | ORAL_TABLET | Freq: Four times a day (QID) | ORAL | Status: DC | PRN
  Administered 2020-07-03: 50 mg via ORAL

## 2020-07-02 MED ORDER — MORPHINE SULFATE (PF) 4 MG/ML IV SOLN: 1.0000 mg | INTRAVENOUS | Status: DC | PRN

## 2020-07-02 MED ORDER — PROPOFOL 10 MG/ML IV BOLUS
INTRAVENOUS | Status: DC | PRN
  Administered 2020-07-02: 40 mg via INTRAVENOUS

## 2020-07-02 MED ORDER — CHLORHEXIDINE GLUCONATE 0.12 % MT SOLN
OROMUCOSAL | Status: AC
  Administered 2020-07-02: 15 mL via OROMUCOSAL
  Filled 2020-07-02: qty 15

## 2020-07-02 MED ORDER — SODIUM CHLORIDE 0.9 % IV SOLN: INTRAVENOUS | Status: DC

## 2020-07-02 MED ORDER — DOCUSATE SODIUM 100 MG PO CAPS
100.0000 mg | ORAL_CAPSULE | Freq: Two times a day (BID) | ORAL | Status: DC
  Administered 2020-07-02 – 2020-07-03 (×2): 100 mg via ORAL
  Filled 2020-07-02 (×4): qty 1

## 2020-07-02 MED ORDER — ORAL CARE MOUTH RINSE: 15.0000 mL | Freq: Once | OROMUCOSAL | Status: AC

## 2020-07-02 MED ORDER — HYDROCODONE-ACETAMINOPHEN 5-325 MG PO TABS
1.0000 | ORAL_TABLET | ORAL | Status: DC | PRN
  Administered 2020-07-02 – 2020-07-03 (×3): 2 via ORAL

## 2020-07-02 MED ORDER — ACETAMINOPHEN 10 MG/ML IV SOLN
INTRAVENOUS | Status: DC | PRN
  Administered 2020-07-02: 1000 mg via INTRAVENOUS

## 2020-07-02 MED ORDER — ADULT MULTIVITAMIN W/MINERALS CH
1.0000 | ORAL_TABLET | Freq: Every day | ORAL | Status: DC
  Administered 2020-07-03: 1 via ORAL
  Filled 2020-07-02 (×2): qty 1

## 2020-07-02 MED ORDER — ACETAMINOPHEN 325 MG PO TABS: 325.0000 mg | ORAL_TABLET | ORAL | Status: DC | PRN

## 2020-07-02 MED ORDER — DROPERIDOL 2.5 MG/ML IJ SOLN
0.6250 mg | Freq: Once | INTRAMUSCULAR | Status: DC | PRN
  Filled 2020-07-02: qty 0.25

## 2020-07-02 MED ORDER — FENTANYL CITRATE (PF) 100 MCG/2ML IJ SOLN: 25.0000 ug | INTRAMUSCULAR | Status: DC | PRN

## 2020-07-02 MED ORDER — KETOROLAC TROMETHAMINE 15 MG/ML IJ SOLN
INTRAMUSCULAR | Status: AC
  Filled 2020-07-02: qty 1

## 2020-07-02 MED ORDER — BISACODYL 10 MG RE SUPP
10.0000 mg | Freq: Every day | RECTAL | Status: DC | PRN
  Filled 2020-07-02: qty 1

## 2020-07-02 MED ORDER — ACETAMINOPHEN 500 MG PO TABS
ORAL_TABLET | ORAL | Status: AC
  Filled 2020-07-02: qty 1

## 2020-07-02 MED ORDER — ONDANSETRON HCL 4 MG/2ML IJ SOLN: 4.0000 mg | Freq: Four times a day (QID) | INTRAMUSCULAR | Status: DC | PRN

## 2020-07-02 MED ORDER — CHLORHEXIDINE GLUCONATE 0.12 % MT SOLN: 15.0000 mL | Freq: Once | OROMUCOSAL | Status: AC

## 2020-07-02 MED ORDER — ONDANSETRON HCL 4 MG/2ML IJ SOLN
4.0000 mg | Freq: Once | INTRAMUSCULAR | Status: DC | PRN
  Administered 2020-07-02: 4 mg via INTRAVENOUS

## 2020-07-02 MED ORDER — KETOROLAC TROMETHAMINE 15 MG/ML IJ SOLN
INTRAMUSCULAR | Status: AC
  Administered 2020-07-02: 15 mg via INTRAVENOUS
  Filled 2020-07-02: qty 1

## 2020-07-02 MED ORDER — BUPIVACAINE HCL (PF) 0.5 % IJ SOLN
INTRAMUSCULAR | Status: AC
  Filled 2020-07-02: qty 30

## 2020-07-02 MED ORDER — ACETAMINOPHEN 500 MG PO TABS
500.0000 mg | ORAL_TABLET | Freq: Four times a day (QID) | ORAL | Status: AC
  Administered 2020-07-02: 500 mg via ORAL

## 2020-07-02 MED ORDER — KETOROLAC TROMETHAMINE 15 MG/ML IJ SOLN: 15.0000 mg | Freq: Once | INTRAMUSCULAR | Status: AC

## 2020-07-02 MED ORDER — FENTANYL CITRATE (PF) 100 MCG/2ML IJ SOLN
INTRAMUSCULAR | Status: AC
  Filled 2020-07-02: qty 2

## 2020-07-02 MED ORDER — FERROUS SULFATE 325 (65 FE) MG PO TABS
325.0000 mg | ORAL_TABLET | Freq: Every day | ORAL | Status: DC
  Administered 2020-07-03: 325 mg via ORAL
  Filled 2020-07-02 (×2): qty 1

## 2020-07-02 MED ORDER — ALBUTEROL SULFATE HFA 108 (90 BASE) MCG/ACT IN AERS
2.0000 | INHALATION_SPRAY | Freq: Four times a day (QID) | RESPIRATORY_TRACT | Status: DC | PRN
  Filled 2020-07-02: qty 6.7

## 2020-07-02 MED ORDER — ACETAMINOPHEN 325 MG PO TABS: 325.0000 mg | ORAL_TABLET | Freq: Four times a day (QID) | ORAL | Status: DC | PRN

## 2020-07-02 MED ORDER — BUPIVACAINE-EPINEPHRINE (PF) 0.5% -1:200000 IJ SOLN
INTRAMUSCULAR | Status: DC | PRN
  Administered 2020-07-02: 30 mL via PERINEURAL

## 2020-07-02 MED ORDER — CEFAZOLIN SODIUM-DEXTROSE 2-4 GM/100ML-% IV SOLN
2.0000 g | Freq: Four times a day (QID) | INTRAVENOUS | Status: AC
  Administered 2020-07-02 – 2020-07-03 (×2): 2 g via INTRAVENOUS

## 2020-07-02 MED ORDER — LACTATED RINGERS IV SOLN: INTRAVENOUS | Status: DC

## 2020-07-02 MED ORDER — BUPIVACAINE HCL (PF) 0.5 % IJ SOLN
INTRAMUSCULAR | Status: DC | PRN
  Administered 2020-07-02: 3 mL

## 2020-07-02 MED ORDER — TRANEXAMIC ACID 1000 MG/10ML IV SOLN
INTRAVENOUS | Status: DC | PRN
  Administered 2020-07-02: 1000 mg via TOPICAL

## 2020-07-02 MED ORDER — PROPOFOL 500 MG/50ML IV EMUL
INTRAVENOUS | Status: DC | PRN
  Administered 2020-07-02: 75 ug/kg/min via INTRAVENOUS

## 2020-07-02 MED ORDER — CEFAZOLIN SODIUM-DEXTROSE 2-4 GM/100ML-% IV SOLN
INTRAVENOUS | Status: AC
  Filled 2020-07-02: qty 100

## 2020-07-02 MED ORDER — METOPROLOL TARTRATE 50 MG PO TABS: 50.0000 mg | ORAL_TABLET | Freq: Two times a day (BID) | ORAL | Status: DC

## 2020-07-02 MED ORDER — FLEET ENEMA 7-19 GM/118ML RE ENEM: 1.0000 | ENEMA | Freq: Once | RECTAL | Status: DC | PRN

## 2020-07-02 MED ORDER — AMLODIPINE BESYLATE 5 MG PO TABS
5.0000 mg | ORAL_TABLET | Freq: Every day | ORAL | Status: DC
  Filled 2020-07-02 (×2): qty 1

## 2020-07-02 MED ORDER — PRAVASTATIN SODIUM 40 MG PO TABS
40.0000 mg | ORAL_TABLET | Freq: Every day | ORAL | Status: DC
  Administered 2020-07-03: 40 mg via ORAL
  Filled 2020-07-02 (×2): qty 1

## 2020-07-02 MED ORDER — MAGNESIUM HYDROXIDE 400 MG/5ML PO SUSP: 30.0000 mL | Freq: Every day | ORAL | Status: DC | PRN

## 2020-07-02 MED ORDER — EPHEDRINE SULFATE 50 MG/ML IJ SOLN
INTRAMUSCULAR | Status: DC | PRN
  Administered 2020-07-02: 10 mg via INTRAVENOUS

## 2020-07-02 MED ORDER — SCOPOLAMINE 1 MG/3DAYS TD PT72
MEDICATED_PATCH | TRANSDERMAL | Status: AC
  Administered 2020-07-02: 1.5 mg via TRANSDERMAL
  Filled 2020-07-02: qty 1

## 2020-07-02 MED ORDER — DIPHENHYDRAMINE HCL 12.5 MG/5ML PO ELIX
12.5000 mg | ORAL_SOLUTION | ORAL | Status: DC | PRN
  Filled 2020-07-02: qty 10

## 2020-07-02 MED ORDER — FENTANYL CITRATE (PF) 100 MCG/2ML IJ SOLN
INTRAMUSCULAR | Status: DC | PRN
  Administered 2020-07-02 (×2): 25 ug via INTRAVENOUS
  Administered 2020-07-02: 50 ug via INTRAVENOUS

## 2020-07-02 MED ORDER — EPINEPHRINE PF 1 MG/ML IJ SOLN
INTRAMUSCULAR | Status: AC
  Filled 2020-07-02: qty 1

## 2020-07-02 MED ORDER — SODIUM CHLORIDE 0.9 % IV SOLN
INTRAVENOUS | Status: DC | PRN
  Administered 2020-07-02: 60 mL

## 2020-07-02 MED ORDER — HYDROCODONE-ACETAMINOPHEN 5-325 MG PO TABS
ORAL_TABLET | ORAL | Status: AC
  Filled 2020-07-02: qty 2

## 2020-07-02 MED ORDER — SCOPOLAMINE 1 MG/3DAYS TD PT72: 1.0000 | MEDICATED_PATCH | TRANSDERMAL | Status: DC

## 2020-07-02 MED ORDER — ONDANSETRON HCL 4 MG/2ML IJ SOLN
INTRAMUSCULAR | Status: AC
  Filled 2020-07-02: qty 2

## 2020-07-02 MED ORDER — ONDANSETRON HCL 4 MG/2ML IJ SOLN
INTRAMUSCULAR | Status: DC | PRN
  Administered 2020-07-02: 4 mg via INTRAVENOUS

## 2020-07-02 MED ORDER — HAIR/SKIN/NAILS PO CAPS: ORAL_CAPSULE | Freq: Every day | ORAL | Status: DC

## 2020-07-02 MED ORDER — SODIUM CHLORIDE 0.9 % IV SOLN
INTRAVENOUS | Status: DC | PRN
  Administered 2020-07-02: 40 ug/min via INTRAVENOUS

## 2020-07-02 MED ORDER — SODIUM CHLORIDE FLUSH 0.9 % IV SOLN
INTRAVENOUS | Status: AC
  Filled 2020-07-02: qty 40

## 2020-07-02 MED ORDER — PANTOPRAZOLE SODIUM 40 MG PO TBEC
40.0000 mg | DELAYED_RELEASE_TABLET | Freq: Every day | ORAL | Status: DC
  Administered 2020-07-03: 40 mg via ORAL
  Filled 2020-07-02 (×2): qty 1

## 2020-07-02 MED ORDER — METOCLOPRAMIDE HCL 5 MG/ML IJ SOLN
5.0000 mg | Freq: Three times a day (TID) | INTRAMUSCULAR | Status: DC | PRN
  Administered 2020-07-03: 10 mg via INTRAVENOUS

## 2020-07-02 MED ORDER — ONDANSETRON HCL 4 MG PO TABS: 4.0000 mg | ORAL_TABLET | Freq: Four times a day (QID) | ORAL | Status: DC | PRN

## 2020-07-02 MED ORDER — CEFAZOLIN SODIUM-DEXTROSE 2-4 GM/100ML-% IV SOLN
2.0000 g | INTRAVENOUS | Status: AC
  Administered 2020-07-02: 2 g via INTRAVENOUS

## 2020-07-02 MED ORDER — TRANEXAMIC ACID 1000 MG/10ML IV SOLN
INTRAVENOUS | Status: AC
  Filled 2020-07-02: qty 10

## 2020-07-02 MED ORDER — METOCLOPRAMIDE HCL 10 MG PO TABS: 5.0000 mg | ORAL_TABLET | Freq: Three times a day (TID) | ORAL | Status: DC | PRN

## 2020-07-02 MED ORDER — LISINOPRIL 20 MG PO TABS
20.0000 mg | ORAL_TABLET | Freq: Every day | ORAL | Status: DC
  Filled 2020-07-02 (×2): qty 1

## 2020-07-02 MED ORDER — ACETAMINOPHEN 160 MG/5ML PO SOLN
325.0000 mg | ORAL | Status: DC | PRN
  Filled 2020-07-02: qty 20.3

## 2020-07-02 SURGICAL SUPPLY — 62 items
BIT DRILL QUICK REL 1/8 2PK SL (DRILL) ×1 IMPLANT
BLADE SAW SAG 25X90X1.19 (BLADE) ×2 IMPLANT
BLADE SURG SZ20 CARB STEEL (BLADE) ×2 IMPLANT
BNDG ELASTIC 6X5.8 VLCR NS LF (GAUZE/BANDAGES/DRESSINGS) ×2 IMPLANT
CANISTER SUCT 1200ML W/VALVE (MISCELLANEOUS) ×2 IMPLANT
CANISTER SUCT 3000ML PPV (MISCELLANEOUS) ×2 IMPLANT
CEMENT BONE R 1X40 (Cement) ×4 IMPLANT
CEMENT VACUUM MIXING SYSTEM (MISCELLANEOUS) ×2 IMPLANT
CHLORAPREP W/TINT 26 (MISCELLANEOUS) ×2 IMPLANT
COOLER POLAR GLACIER W/PUMP (MISCELLANEOUS) ×2 IMPLANT
COVER MAYO STAND REUSABLE (DRAPES) ×2 IMPLANT
COVER WAND RF STERILE (DRAPES) ×2 IMPLANT
CUFF TOURN SGL QUICK 24 (TOURNIQUET CUFF)
CUFF TOURN SGL QUICK 30 (TOURNIQUET CUFF)
CUFF TOURN SGL QUICK 34 (TOURNIQUET CUFF) ×1
CUFF TRNQT CYL 24X4X16.5-23 (TOURNIQUET CUFF) IMPLANT
CUFF TRNQT CYL 30X4X21-28X (TOURNIQUET CUFF) IMPLANT
CUFF TRNQT CYL 34X4X40X1 (TOURNIQUET CUFF) ×1 IMPLANT
DRAPE 3/4 80X56 (DRAPES) ×2 IMPLANT
DRAPE IMP U-DRAPE 54X76 (DRAPES) ×2 IMPLANT
DRILL QUICK RELEASE 1/8 INCH (DRILL) ×1
DRSG MEPILEX SACRM 8.7X9.8 (GAUZE/BANDAGES/DRESSINGS) IMPLANT
DRSG OPSITE POSTOP 4X10 (GAUZE/BANDAGES/DRESSINGS) IMPLANT
DRSG OPSITE POSTOP 4X8 (GAUZE/BANDAGES/DRESSINGS) ×2 IMPLANT
ELECT REM PT RETURN 9FT ADLT (ELECTROSURGICAL) ×2
ELECTRODE REM PT RTRN 9FT ADLT (ELECTROSURGICAL) ×1 IMPLANT
GLOVE BIO SURGEON STRL SZ7.5 (GLOVE) ×8 IMPLANT
GLOVE BIO SURGEON STRL SZ8 (GLOVE) ×8 IMPLANT
GLOVE BIOGEL PI IND STRL 8 (GLOVE) ×1 IMPLANT
GLOVE BIOGEL PI INDICATOR 8 (GLOVE) ×1
GLOVE INDICATOR 8.0 STRL GRN (GLOVE) ×2 IMPLANT
GOWN STRL REUS W/ TWL LRG LVL3 (GOWN DISPOSABLE) ×1 IMPLANT
GOWN STRL REUS W/ TWL XL LVL3 (GOWN DISPOSABLE) ×1 IMPLANT
GOWN STRL REUS W/TWL LRG LVL3 (GOWN DISPOSABLE) ×1
GOWN STRL REUS W/TWL XL LVL3 (GOWN DISPOSABLE) ×1
HOOD PEEL AWAY FLYTE STAYCOOL (MISCELLANEOUS) ×6 IMPLANT
INSERT TIB BEARING 67X10 (Insert) ×2 IMPLANT
KIT TURNOVER KIT A (KITS) ×2 IMPLANT
KNEE CR FEMORAL RT 62.5MM (Femur) ×2 IMPLANT
MANIFOLD NEPTUNE II (INSTRUMENTS) ×2 IMPLANT
NEEDLE SPNL 20GX3.5 QUINCKE YW (NEEDLE) ×2 IMPLANT
NS IRRIG 1000ML POUR BTL (IV SOLUTION) ×2 IMPLANT
PACK TOTAL KNEE (MISCELLANEOUS) ×2 IMPLANT
PAD WRAPON POLAR KNEE (MISCELLANEOUS) ×1 IMPLANT
PATELLA SERIES A (Orthopedic Implant) ×2 IMPLANT
PENCIL SMOKE EVACUATOR (MISCELLANEOUS) ×2 IMPLANT
PLATE INTERLOK 6700 (Plate) ×2 IMPLANT
PULSAVAC PLUS IRRIG FAN TIP (DISPOSABLE) ×2
SOL .9 NS 3000ML IRR  AL (IV SOLUTION) ×1
SOL .9 NS 3000ML IRR UROMATIC (IV SOLUTION) ×1 IMPLANT
STAPLER SKIN PROX 35W (STAPLE) ×2 IMPLANT
SUCTION FRAZIER HANDLE 10FR (MISCELLANEOUS) ×1
SUCTION TUBE FRAZIER 10FR DISP (MISCELLANEOUS) ×1 IMPLANT
SUT VIC AB 0 CT1 36 (SUTURE) ×6 IMPLANT
SUT VIC AB 2-0 CT1 (SUTURE) ×2 IMPLANT
SUT VIC AB 2-0 CT1 27 (SUTURE) ×3
SUT VIC AB 2-0 CT1 TAPERPNT 27 (SUTURE) ×3 IMPLANT
SYR 10ML LL (SYRINGE) ×2 IMPLANT
SYR 20ML LL LF (SYRINGE) ×2 IMPLANT
SYR 30ML LL (SYRINGE) IMPLANT
TIP FAN IRRIG PULSAVAC PLUS (DISPOSABLE) ×1 IMPLANT
WRAPON POLAR PAD KNEE (MISCELLANEOUS) ×2

## 2020-07-02 NOTE — Transfer of Care (Signed)
Immediate Anesthesia Transfer of Care Note  Patient: Alyannah Pennix Sollenberger  Procedure(s) Performed: TOTAL KNEE ARTHROPLASTY (Right Knee)  Patient Location: PACU  Anesthesia Type:Spinal  Level of Consciousness: awake and sedated  Airway & Oxygen Therapy: Patient Spontanous Breathing and Patient connected to face mask oxygen  Post-op Assessment: Report given to RN and Post -op Vital signs reviewed and stable  Post vital signs: Reviewed and stable  Last Vitals:  Vitals Value Taken Time  BP    Temp    Pulse 64 07/02/20 1416  Resp 20 07/02/20 1416  SpO2 100 % 07/02/20 1416  Vitals shown include unvalidated device data.  Last Pain:  Vitals:   07/02/20 1019  TempSrc: Temporal  PainSc: 0-No pain         Complications: No complications documented.

## 2020-07-02 NOTE — H&P (Signed)
History of Present Illness: Hannah Davila is a 75 y.o. female who presents today for her surgical history and physical for upcoming right total knee arthroplasty. Patient is scheduled to undergo a right total knee arthroplasty with Dr. Joice Lofts on 07/02/2020. The patient denies any changes in her medical history since she was last evaluated. She denies any trauma or falls affecting the right leg since her last evaluation. She denies any numbness or tingling to the right lower extremity at today's appointment. Pain score is a 7 out of 10 in the right knee with prolonged periods of walking or standing. The patient denies any personal history of heart attack or stroke. She denies any history of asthma or COPD. No history of blood clot. The patient does report a history of syncopal episodes, she did undergo an echocardiogram in 2008 which demonstrated ejection fraction of 55%.  Past Medical History: . Abnormal barium swallow 06/11/2018  . Allergic state  . Asthma 11/27/2013  . Asthma without status asthmaticus, unspecified  . Cataract cortical, senile  . Chest pain, mid sternal 07/12/2016  . Epilepsy (CMS-HCC)  . HH (hiatus hernia) 07/12/2016  . History of adenomatous polyp of colon 07/12/2016  . History of chest pain  . History of Helicobacter pylori infection 07/12/2016  . History of hematuria  . Hypercholesterolemia  . Hyperlipidemia  . Hypertension  . Osteoarthritis Left knee.  . Syncopal episodes  a. echo 07/05/06 shows normal LV function, EF 55% with probably diastolic dysfunction based on LVH and A wave dominant mitral inflow pattern. b) myoview 07/05/06 shows no evidence for scar or myocardial ischemia with very good exercise tolerance for age. c. loop monitor for one month shows predominantly normal sinus rhythm with no significant tachy or brady arrhythmias.   Past Surgical History: . COLONOSCOPY 03/01/2000, 12/16/1992  FH Colon Polyps (Brothers)  . COLONOSCOPY 11/15/2012  Adenomatous Polyp, FH  Colon Polyps (Brothers): CBF 10/2017; Recall Ltr mailed 09/18/2017 (dh)  . COLONOSCOPY 06/25/2018  Adenomatous Polyp; FHCP (Brother) CBF 05/2023  . EGD 09/26/2013  H Pylori: No repeat per RTE  . EGD 06/25/2018  Gastritis (Minimal) No repeat per RTE  . HYSTERECTOMY  Total hysterectomy secondary to fibroids  . Lipoma removed from Right Upper Arm 03/2013  for benign mass at [facility]  . REPLACEMENT TOTAL KNEE Left 03/26/2018  . Wisdom teeth   Past Family History: . Prostate cancer Father  . Heart disease Father  "heart condition"  . High blood pressure (Hypertension) Father  . Lung cancer Sister  . High blood pressure (Hypertension) Mother  . Diabetes type II Mother  . Myocardial Infarction (Heart attack) Mother 52  . Asthma Mother  . Diabetes type II Brother  . Colon polyps Brother  . High blood pressure (Hypertension) Brother  . Diabetes type II Sister  . High blood pressure (Hypertension) Sister  . Breast cancer Other  sibling   Medications: . acetaminophen (TYLENOL) 500 MG tablet Take 500 mg by mouth as needed  . albuterol 90 mcg/actuation inhaler Inhale 2 inhalations into the lungs every 6 (six) hours as needed for Wheezing. 1 Inhaler 0  . amLODIPine (NORVASC) 5 MG tablet Take 5 mg by mouth once daily  . aspirin (ASPIRIN LOW DOSE) 81 MG EC tablet Take 81 mg by mouth once daily.  . diclofenac (VOLTAREN) 1 % topical gel Apply 2 g topically 2 (two) times daily  . ferrous sulfate 134 mg (27 mg iron) Tab Take by mouth Take by mouth 3 (three) times a  week.  . lisinopril (PRINIVIL,ZESTRIL) 20 MG tablet Take 20 mg by mouth once daily.  . metoprolol tartrate (LOPRESSOR) 50 MG tablet Take 50 mg by mouth once daily  . multivitamin tablet Take 1 tablet by mouth once daily.  . mv,Ca,min-iron gluc-FA-biotin (HAIR,SKIN & NAILS) 1 mg iron-66.7 mcg-1,000 mcg Tab Take 1 tablet by mouth once daily.  . naproxen (NAPROSYN) 500 MG tablet Take by mouth Take 1 tablet (500 mg total) by mouth  every morning.  Marland Kitchen omeprazole (PRILOSEC) 20 MG DR capsule Take 1 capsule (20 mg total) by mouth once daily 90 capsule 3  . pravastatin (PRAVACHOL) 40 MG tablet Take 40 mg by mouth. at bedtime  . PROAIR HFA 90 mcg/actuation inhaler Inhale 1 inhalation into the lungs as needed   Allergies: . Oxycodone Nausea And Vomiting   Review of Systems:  A comprehensive 14 point ROS was performed, reviewed by me today, and the pertinent orthopaedic findings are documented in the HPI.  Physical Exam: BP 132/82  Ht 162.6 cm (5\' 4" )  BMI 37.21 kg/m  General/Constitutional: The patient appears to be well-nourished, well-developed, and in no acute distress. Neuro/Psych: Normal mood and affect, oriented to person, place and time. Eyes: Non-icteric. Pupils are equal, round, and reactive to light, and exhibit synchronous movement. ENT: Unremarkable. Lymphatic: No palpable adenopathy. Respiratory: Lungs clear to auscultation, Normal chest excursion, No wheezes and Non-labored breathing Cardiovascular: Regular rate and rhythm. No murmurs. and No edema, swelling or tenderness, except as noted in detailed exam. Integumentary: No impressive skin lesions present, except as noted in detailed exam. Musculoskeletal: Unremarkable, except as noted in detailed exam.  Rightknee exam: GAIT:Mild limp and uses a cane for balance and support ALIGNMENT:Mild varus SKIN:Unremarkable SWELLING:Mild EFFUSION:Small WARMTH:None TENDERNESS:Mildlytender alongmedial joint line ROM:0-115 degrees without pain McMURRAY'S:Equivocal PATELLOFEMORAL:Normal tracking with no peri-patellar tenderness and negative apprehension sign CREPITUS:Trace patellofemoralcrepitance LACHMAN'S:Negative PIVOT SHIFT:Negative ANTERIOR DRAWER:Negative POSTERIOR DRAWER:Negative VARUS/VALGUS:Mildly positive pseudolaxity to varus stressing  She again is neurovascularly intact to the right lower extremity and  foot.  Imaging: Previous x-rays of the right knee demonstrated significant osteoarthritic changes to the right knee involving all 3 compartments. The patient is status post a left total knee arthroplasty.  Impression: Primary osteoarthritis of right knee.  Plan:  1. Treatment options were discussed today with the patient. 2. Surgery scheduled with Dr. Roland Rack to undergo a right total knee arthroplasty on 07/02/2020. 3. The patient was instructed on the risk and benefits of surgery and wishes to proceed at this time. 4. The patient was instructed to stop her aspirin and any anti-inflammatory medication on Saturday. 5. This document will serve as a surgical history and physical for the patient. 6. The patient will follow-up per standard post-op protocol. They can call the clinic they have any questions, new symptoms develop or symptoms worsen.  The procedure was discussed with the patient, as were the potential risks (including bleeding, infection, nerve and/or blood vessel injury, persistent or recurrent pain, failure of the hardware, poly wear, need for further surgery, blood clots, strokes, heart attacks and/or arhythmias, pneumonia, etc.) and benefits. The patient states her understanding and wishes to proceed.   H&P reviewed and patient re-examined. No changes.

## 2020-07-02 NOTE — Anesthesia Procedure Notes (Signed)
Spinal  Patient location during procedure: OR Start time: 07/02/2020 11:45 AM End time: 07/02/2020 11:50 AM Staffing Performed: resident/CRNA  Resident/CRNA: Nelda Marseille, CRNA Preanesthetic Checklist Completed: patient identified, IV checked, site marked, risks and benefits discussed, surgical consent, monitors and equipment checked, pre-op evaluation and timeout performed Spinal Block Patient position: sitting Prep: Betadine Patient monitoring: heart rate, continuous pulse ox, blood pressure and cardiac monitor Approach: midline Location: L3-4 Injection technique: single-shot Needle Needle type: Whitacre and Introducer  Needle gauge: 25 G Needle length: 9 cm Assessment Sensory level: T10 Additional Notes Negative paresthesia. Negative blood return. Positive free-flowing CSF. Expiration date of kit checked and confirmed. Patient tolerated procedure well, without complications.

## 2020-07-02 NOTE — Progress Notes (Signed)
Pt unable to void, bladder feeling full. Bladder scan Hr 40-50's metoprolol 50mg  order. Per dr. hold BP meds, place foley cath for the night and d/c in the am. Pt verbalized understanding. Requested her daughter be notified, Daughter Rosita Kea aware and also verbalized understanding.

## 2020-07-02 NOTE — Anesthesia Preprocedure Evaluation (Addendum)
Anesthesia Evaluation  Patient identified by MRN, date of birth, ID band Patient awake    Reviewed: Allergy & Precautions, H&P , NPO status , reviewed documented beta blocker date and time   History of Anesthesia Complications (+) PONV and history of anesthetic complications  Airway Mallampati: III  TM Distance: >3 FB Neck ROM: full    Dental  (+) Missing, Dental Advidsory Given Multiple missing teeth, no loose:   Pulmonary shortness of breath and with exertion, asthma ,    Pulmonary exam normal        Cardiovascular hypertension, Normal cardiovascular exam  No change EKG from 2006   Neuro/Psych Seizures -, Well Controlled,  No meds for seizures, none x 50 yrs. Completely resolved per pt.    GI/Hepatic hiatal hernia, GERD  Medicated and Controlled,  Endo/Other    Renal/GU      Musculoskeletal  (+) Arthritis ,   Abdominal   Peds  Hematology   Anesthesia Other Findings Past Medical History: No date: Abnormal barium swallow No date: Allergic genetic state No date: Arthritis     Comment:  OSTEOARTHRITIS No date: Asthma No date: Cataract cortical, senile No date: Chest pain No date: Complication of anesthesia No date: Dyspnea No date: Epilepsy (HCC) No date: GERD (gastroesophageal reflux disease) No date: History of colon polyps No date: History of Helicobacter pylori infection No date: History of hematuria No date: History of hiatal hernia No date: Hypercholesterolemia No date: Hypertension No date: PONV (postoperative nausea and vomiting) No date: Syncopal episodes  Past Surgical History: No date: ABDOMINAL HYSTERECTOMY No date: APPENDECTOMY No date: COLONOSCOPY 06/25/2018: COLONOSCOPY WITH PROPOFOL; N/A     Comment:  Procedure: COLONOSCOPY WITH PROPOFOL;  Surgeon: Scot Jun, MD;  Location: [facility] ENDOSCOPY;  Service:               Endoscopy;  Laterality: N/A; No date:  ESOPHAGOGASTRODUODENOSCOPY 06/25/2018: ESOPHAGOGASTRODUODENOSCOPY (EGD) WITH PROPOFOL; N/A     Comment:  Procedure: ESOPHAGOGASTRODUODENOSCOPY (EGD) WITH               PROPOFOL;  Surgeon: Scot Jun, MD;  Location:               [facility] ENDOSCOPY;  Service: Endoscopy;  Laterality: N/A; 03/26/2018: JOINT REPLACEMENT; Left No date: REMOVAL WISDOM TEETH No date: RT.ARM SURGERY     Comment:  BENIGN MASS AT [place]     Reproductive/Obstetrics                           Anesthesia Physical Anesthesia Plan  ASA: II  Anesthesia Plan: Spinal   Post-op Pain Management:    Induction: Intravenous  PONV Risk Score and Plan: 3 and Propofol infusion, Treatment may vary due to age or medical condition, Midazolam and Ondansetron  Airway Management Planned: Nasal Cannula and Natural Airway  Additional Equipment:   Intra-op Plan:   Post-operative Plan:   Informed Consent: I have reviewed the patients History and Physical, chart, labs and discussed the procedure including the risks, benefits and alternatives for the proposed anesthesia with the patient or authorized representative who has indicated his/her understanding and acceptance.     Dental Advisory Given  Plan Discussed with: CRNA  Anesthesia Plan Comments: (Spinal with IV sedation discussed and accepted.)       Anesthesia Quick Evaluation

## 2020-07-02 NOTE — Op Note (Signed)
07/02/2020  2:09 PM  Patient:   Hannah Davila  Pre-Op Diagnosis:   Degenerative joint disease, right knee.  Post-Op Diagnosis:   Same  Procedure:   Right TKA using all-cemented Biomet Vanguard system with a 62.5 mm PCR femur, a 67 mm tibial tray with a 10 mm anterior stabilized E-poly insert, and a 31 x 8 mm all-poly 3-pegged domed patella.  Surgeon:   Pascal Lux, MD  Assistant:   Cameron Proud, PA-C   Anesthesia:   Spinal  Findings:   As above  Complications:   None  EBL:   10 cc  Fluids:   700 cc crystalloid  UOP:   None  TT:   90 minutes at 300 mmHg  Drains:   None  Closure:   Staples  Implants:   As above  Brief Clinical Note:   The patient is a 75 year old female with a long history of progressively worsening right knee pain. The patient's symptoms have progressed despite medications, activity modification, injections, etc. The patient's history and examination were consistent with advanced degenerative joint disease of the right knee confirmed by plain radiographs. The patient presents at this time for a right total knee arthroplasty.  Procedure:   The patient was brought into the operating room. After adequate spinal anesthesia was obtained, the patient was lain in the supine position before the right lower extremity was prepped with ChloraPrep solution and draped sterilely. Preoperative antibiotics were administered. After verifying the proper laterality with a surgical timeout, the limb was exsanguinated with an Esmarch and the tourniquet inflated to 300 mmHg. A standard anterior approach to the knee was made through an approximately 7 inch incision. The incision was carried down through the subcutaneous tissues to expose superficial retinaculum. This was split the length of the incision and the medial flap elevated sufficiently to expose the medial retinaculum. The medial retinaculum was incised, leaving a 3-4 mm cuff of tissue on the patella. This was  extended distally along the medial border of the patellar tendon and proximally through the medial third of the quadriceps tendon. A subtotal fat pad excision was performed before the soft tissues were elevated off the anteromedial and anterolateral aspects of the proximal tibia to the level of the collateral ligaments. The anterior portions of the medial and lateral menisci were removed, as was the anterior cruciate ligament. With the knee flexed to 90, the external tibial guide was positioned and the appropriate proximal tibial cut made. This piece was taken to the back table where it was measured and found to be optimally replicated by a 67 mm component.  Attention was directed to the distal femur. The intramedullary canal was accessed through a 3/8" drill hole. The intramedullary guide was inserted and positioned in order to obtain a neutral flexion gap. The intercondylar block was positioned with care taken to avoid notching the anterior cortex of the femur. The appropriate cut was made. Next, the distal cutting block was placed at 6 of valgus alignment. Using the 9 mm slot, the distal cut was made. The distal femur was measured and found to be optimally replicated by the 123XX123 mm component. The 62.5 mm 4-in-1 cutting block was positioned and first the posterior, then the posterior chamfer, the anterior chamfer, and finally the anterior cuts were made. At this point, the posterior portions medial and lateral menisci were removed. A trial reduction was performed using the appropriate femoral and tibial components with the 10 mm insert. This demonstrated excellent stability to  varus and valgus stressing both in flexion and extension while permitting full extension. Patella tracking was assessed and found to be excellent. Therefore, the tibial guide position was marked on the proximal tibia. The patella thickness was measured and found to be 21 mm. Therefore, the appropriate cut was made. The patellar surface  was measured and found to be optimally replicated by the 31 mm component. The three peg holes were drilled in place before the trial button was inserted. Patella tracking was assessed and found to be excellent, passing the "no thumb test". The lug holes were drilled into the distal femur before the trial component was removed, leaving only the tibial tray. The keel was then created using the appropriate tower, reamer, and punch.  The bony surfaces were prepared for cementing by irrigating them thoroughly with sterile saline solution via the jet lavage system. A bone plug was fashioned from some of the bone that had been removed previously and used to plug the distal femoral canal. In addition, 20 cc of Exparel diluted out to 60 cc with normal saline and 30 cc of 0.5% Sensorcaine were injected into the postero-medial and postero-lateral aspects of the knee, the medial and lateral gutter regions, and the peri-incisional tissues to help with postoperative analgesia. Meanwhile, the cement was being mixed on the back table. When it was ready, the tibial tray was cemented in first. The excess cement was removed using Personal assistant. Next, the femoral component was impacted into place. Again, the excess cement was removed using Personal assistant. The 10 mm trial insert was positioned and the knee brought into extension while the cement hardened. Finally, the patella was cemented into place and secured using the patellar clamp. Again, the excess cement was removed using Personal assistant. Once the cement had hardened, the knee was placed through a range of motion with the findings as described above. Therefore, the trial insert was removed and, after verifying that no cement had been retained posteriorly, the permanent 10 mm anterior stabilized E-polyethylene insert was positioned and secured using the appropriate key locking mechanism. Again the knee was placed through a range of motion with the findings as described  above.  The wound was copiously irrigated with sterile saline solution using the jet lavage system before the quadriceps tendon and retinacular layer were reapproximated using #0 Vicryl interrupted sutures. The superficial retinacular layer also was closed using a running #0 Vicryl suture. A total of 10 cc of transexemic acid (TXA) was injected intra-articularly before the subcutaneous tissues were closed in several layers using 2-0 Vicryl interrupted sutures. The skin was closed using staples. A sterile honeycomb dressing was applied to the skin before the leg was wrapped with an Ace wrap to accommodate the Polar Care device. The patient was then awakened and returned to the recovery room in satisfactory condition after tolerating the procedure well.

## 2020-07-02 NOTE — Anesthesia Procedure Notes (Signed)
Date/Time: 07/02/2020 11:55 AM Performed by: Junious Silk, CRNA Pre-anesthesia Checklist: Patient identified, Emergency Drugs available, Suction available, Patient being monitored and Timeout performed Oxygen Delivery Method: Simple face mask

## 2020-07-02 NOTE — Progress Notes (Signed)
PHARMACIST - PHYSICIAN ORDER COMMUNICATION  CONCERNING: P&T Medication Policy on Herbal Medications  DESCRIPTION:  This patient's order for:  Hair/Skin/Nails Cap  has been noted.  This product(s) is classified as an "herbal" or natural product. Due to a lack of definitive safety studies or FDA approval, nonstandard manufacturing practices, plus the potential risk of unknown drug-drug interactions while on inpatient medications, the Pharmacy and Therapeutics Committee does not permit the use of "herbal" or natural products of this type within [facility]   ACTION TAKEN: The pharmacy department is unable to verify this order at this time. Please reevaluate patient's clinical condition at discharge and address if the herbal or natural product(s) should be resumed at that time.  Clovia Cuff, PharmD, BCPS 07/02/2020 4:24 PM

## 2020-07-03 ENCOUNTER — Encounter: Payer: Self-pay | Admitting: Surgery

## 2020-07-03 LAB — CBC
HCT: 31.5 % — ABNORMAL LOW (ref 36.0–46.0)
Hemoglobin: 10 g/dL — ABNORMAL LOW (ref 12.0–15.0)
MCH: 29 pg (ref 26.0–34.0)
MCHC: 31.7 g/dL (ref 30.0–36.0)
MCV: 91.3 fL (ref 80.0–100.0)
Platelets: 124 10*3/uL — ABNORMAL LOW (ref 150–400)
RBC: 3.45 MIL/uL — ABNORMAL LOW (ref 3.87–5.11)
RDW: 12.2 % (ref 11.5–15.5)
WBC: 4.9 10*3/uL (ref 4.0–10.5)
nRBC: 0 % (ref 0.0–0.2)

## 2020-07-03 LAB — BASIC METABOLIC PANEL
Anion gap: 6 (ref 5–15)
BUN: 15 mg/dL (ref 8–23)
CO2: 25 mmol/L (ref 22–32)
Calcium: 8.6 mg/dL — ABNORMAL LOW (ref 8.9–10.3)
Chloride: 110 mmol/L (ref 98–111)
Creatinine, Ser: 0.73 mg/dL (ref 0.44–1.00)
GFR, Estimated: >60 mL/min (ref >60)
Glucose, Bld: 121 mg/dL — ABNORMAL HIGH (ref 70–99)
Potassium: 3.9 mmol/L (ref 3.5–5.1)
Sodium: 141 mmol/L (ref 135–145)

## 2020-07-03 MED ORDER — ACETAMINOPHEN 500 MG PO TABS
ORAL_TABLET | ORAL | Status: AC
  Administered 2020-07-03: 500 mg via ORAL
  Filled 2020-07-03: qty 1

## 2020-07-03 MED ORDER — KETOROLAC TROMETHAMINE 15 MG/ML IJ SOLN
INTRAMUSCULAR | Status: AC
  Administered 2020-07-03: 7.5 mg via INTRAVENOUS
  Filled 2020-07-03: qty 1

## 2020-07-03 MED ORDER — TRAMADOL HCL 50 MG PO TABS: 50.0000 mg | ORAL_TABLET | Freq: Four times a day (QID) | ORAL | 0 refills | Status: AC | PRN

## 2020-07-03 MED ORDER — METOCLOPRAMIDE HCL 5 MG/ML IJ SOLN
INTRAMUSCULAR | Status: AC
  Filled 2020-07-03: qty 2

## 2020-07-03 MED ORDER — HYDROCODONE-ACETAMINOPHEN 5-325 MG PO TABS: 1.0000 | ORAL_TABLET | ORAL | 0 refills | Status: AC | PRN

## 2020-07-03 MED ORDER — APIXABAN 2.5 MG PO TABS: 2.5000 mg | ORAL_TABLET | Freq: Two times a day (BID) | ORAL | 0 refills | Status: AC

## 2020-07-03 MED ORDER — HYDROCODONE-ACETAMINOPHEN 5-325 MG PO TABS
ORAL_TABLET | ORAL | Status: AC
  Filled 2020-07-03: qty 2

## 2020-07-03 MED ORDER — ONDANSETRON HCL 4 MG PO TABS: 4.0000 mg | ORAL_TABLET | Freq: Four times a day (QID) | ORAL | 0 refills | Status: AC | PRN

## 2020-07-03 MED ORDER — CEFAZOLIN SODIUM-DEXTROSE 2-4 GM/100ML-% IV SOLN
INTRAVENOUS | Status: AC
  Filled 2020-07-03: qty 100

## 2020-07-03 MED ORDER — TRAMADOL HCL 50 MG PO TABS
ORAL_TABLET | ORAL | Status: AC
  Filled 2020-07-03: qty 1

## 2020-07-03 MED ORDER — APIXABAN 2.5 MG PO TABS
2.5000 mg | ORAL_TABLET | Freq: Two times a day (BID) | ORAL | Status: DC
  Administered 2020-07-03: 2.5 mg via ORAL
  Filled 2020-07-03 (×3): qty 1

## 2020-07-03 MED ORDER — CEFAZOLIN SODIUM-DEXTROSE 2-4 GM/100ML-% IV SOLN
INTRAVENOUS | Status: AC
  Administered 2020-07-03: 2 g via INTRAVENOUS
  Filled 2020-07-03: qty 100

## 2020-07-03 NOTE — Progress Notes (Signed)
  Subjective: 1 Day Post-Op Procedure(s) (LRB): TOTAL KNEE ARTHROPLASTY (Right) Patient reports pain as mild.   Patient is well, did have to have foley cath placed last night, will remove this AM. Plan is to go Home after hospital stay. Negative for chest pain and shortness of breath Fever: no Gastrointestinal:Negative for nausea and vomiting  Objective: Vital signs in last 24 hours: Temp:  [96.9 F (36.1 C)-97 F (36.1 C)] 97 F (36.1 C) (01/06 1550) Pulse Rate:  [46-66] 54 (01/07 0600) Resp:  [13-19] 14 (01/06 1606) BP: (100-140)/(58-72) 126/64 (01/06 1727) SpO2:  [94 %-100 %] 98 % (01/07 0600) Weight:  [98 kg] 98 kg (01/06 1019)  Intake/Output from previous day:  Intake/Output Summary (Last 24 hours) at 07/03/2020 0736 Last data filed at 07/03/2020 1610 Gross per 24 hour  Intake 2266.47 ml  Output 1065 ml  Net 1201.47 ml    Intake/Output this shift: No intake/output data recorded.  Labs: No results for input(s): HGB in the last 72 hours. No results for input(s): WBC, RBC, HCT, PLT in the last 72 hours. No results for input(s): NA, K, CL, CO2, BUN, CREATININE, GLUCOSE, CALCIUM in the last 72 hours. No results for input(s): LABPT, INR in the last 72 hours.   EXAM General - Patient is Alert, Appropriate and Oriented Extremity - ABD soft Neurovascular intact Dorsiflexion/Plantar flexion intact Incision: dressing C/D/I No cellulitis present Dressing/Incision - clean, dry, no drainage, ACE wrap and polar care in place. Motor Function - intact, moving foot and toes well on exam.  Abdomen soft with normal bowel sounds.  Past Medical History:  Diagnosis Date  . Abnormal barium swallow   . Allergic genetic state   . Arthritis    OSTEOARTHRITIS  . Asthma   . Cataract cortical, senile   . Chest pain   . Complication of anesthesia   . Dyspnea   . Epilepsy ([place])   . GERD (gastroesophageal reflux disease)   . History of colon polyps   . History of Helicobacter pylori  infection   . History of hematuria   . History of hiatal hernia   . Hypercholesterolemia   . Hypertension   . PONV (postoperative nausea and vomiting)   . Syncopal episodes     Assessment/Plan: 1 Day Post-Op Procedure(s) (LRB): TOTAL KNEE ARTHROPLASTY (Right) Active Problems:   Status post total knee replacement using cement, right  Estimated body mass index is 37.09 kg/m as calculated from the following:   Height as of this encounter: 5\' 4"  (1.626 m).   Weight as of this encounter: 98 kg. Advance diet Up with therapy D/C IV fluids when tolerating po intake.  Remove foley this AM.  Monitor urination status. Patient is passing gas, continue to work on BM. Up with therapy today. If she does well with PT will plan for discharge home today.  DVT Prophylaxis - Foot Pumps and Eliquis Weight-Bearing as tolerated to right leg  J. Cameron Proud, PA-C [facility] 07/03/2020, 7:36 AM

## 2020-07-03 NOTE — Discharge Summary (Signed)
Physician Discharge Summary  Patient ID: Hannah Davila MRN: [id number] DOB/AGE: [birth year] 75 y.o.  Admit date: 07/02/2020 Discharge date: 07/03/2020  Admission Diagnoses:  Status post total knee replacement using cement, right [Z96.651]  Discharge Diagnoses: Patient Active Problem List   Diagnosis Date Noted  . Status post total knee replacement using cement, 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    Past Medical History:  Diagnosis Date  . Abnormal barium swallow   . Allergic genetic state   . Arthritis    OSTEOARTHRITIS  . Asthma   . Cataract cortical, senile   . Chest pain   . Complication of anesthesia   . Dyspnea   . Epilepsy ([place])   . GERD (gastroesophageal reflux disease)   . History of colon polyps   . History of Helicobacter pylori infection   . History of hematuria   . History of hiatal hernia   . Hypercholesterolemia   . Hypertension   . PONV (postoperative nausea and vomiting)   . Syncopal episodes      Transfusion: None.   Consultants (if any):   Discharged Condition: Improved  Hospital Course: Hannah Davila is an 75 y.o. female who was admitted 07/02/2020 with a diagnosis of degenerative joint disease of the right knee and went to the operating room on 07/02/2020 and underwent the above named procedures.    Surgeries: Procedure(s): TOTAL KNEE ARTHROPLASTY on 07/02/2020 Patient tolerated the surgery well. Taken to PACU where she was stabilized and then transferred to the orthopedic floor.  Started on Elqiuis 2.5mg  twice daily. Foot pumps applied bilaterally at 80 mm. Heels elevated on bed with rolled towels. No evidence of DVT. Negative Homan. Physical therapy started on day #1 for gait training and transfer. OT started day #1 for ADL and assisted devices.  Foley had to be placed on night of surgery due to urinary retention.  It was removed the following day and the patient was able to void on her own.  Patient's IV was removed on POD1.  Implants: Right TKA  using all-cemented Biomet Vanguard system with a 62.5 mm PCR femur, a 67 mm tibial tray with a 10 mm anterior stabilized E-poly insert, and a 31 x 8 mm all-poly 3-pegged domed patella.  She was given perioperative antibiotics:  Anti-infectives (From admission, onward)   Start     Dose/Rate Route Frequency Ordered Stop   07/03/20 0024  ceFAZolin (ANCEF) 2-4 GM/100ML-% IVPB       Note to Pharmacy: Glenford Peers   : cabinet override      07/03/20 0024 07/03/20 0025   07/02/20 1811  ceFAZolin (ANCEF) 2-4 GM/100ML-% IVPB       Note to Pharmacy: Tippett, Amy   : cabinet override      07/02/20 1811 07/03/20 0614   07/02/20 1800  ceFAZolin (ANCEF) IVPB 2g/100 mL premix        2 g 200 mL/hr over 30 Minutes Intravenous Every 6 hours 07/02/20 1613 07/03/20 0646   07/02/20 1015  ceFAZolin (ANCEF) IVPB 2g/100 mL premix        2 g 200 mL/hr over 30 Minutes Intravenous On call to O.R. 07/02/20 1002 07/02/20 1210   07/02/20 1007  ceFAZolin (ANCEF) 2-4 GM/100ML-% IVPB       Note to Pharmacy: Myles Lipps   : cabinet override      07/02/20 1007 07/02/20 1216    .  She was given sequential compression devices, early ambulation, and Eliquis for DVT prophylaxis.  She benefited maximally from the  hospital stay and there were no complications.    Recent vital signs:  Vitals:   07/03/20 1200 07/03/20 1444  BP: 133/64   Pulse: (!) 58   Resp:    Temp: 97.7 F (36.5 C)   SpO2: 96% 97%    Recent laboratory studies:  Lab Results  Component Value Date   HGB 10.0 (L) 07/03/2020   HGB 12.0 06/23/2020   HGB 12.7 09/09/2011   Lab Results  Component Value Date   WBC 4.9 07/03/2020   PLT 124 (L) 07/03/2020   No results found for: INR Lab Results  Component Value Date   NA 141 07/03/2020   K 3.9 07/03/2020   CL 110 07/03/2020   CO2 25 07/03/2020   BUN 15 07/03/2020   CREATININE 0.73 07/03/2020   GLUCOSE 121 (H) 07/03/2020    Discharge Medications:   Allergies as of 07/03/2020       Reactions   Oxycodone Nausea And Vomiting      Medication List    TAKE these medications   acetaminophen 500 MG tablet Commonly known as: TYLENOL Take 500 mg by mouth at bedtime.   albuterol 108 (90 Base) MCG/ACT inhaler Commonly known as: VENTOLIN HFA Inhale 2 puffs into the lungs every 6 (six) hours as needed for wheezing or shortness of breath.   amLODipine 5 MG tablet Commonly known as: NORVASC Take 5 mg by mouth daily.   apixaban 2.5 MG Tabs tablet Commonly known as: ELIQUIS Take 1 tablet (2.5 mg total) by mouth 2 (two) times daily.   aspirin EC 81 MG tablet Take 81 mg by mouth daily.   diclofenac Sodium 1 % Gel Commonly known as: VOLTAREN Apply 2 g topically daily as needed (pain).   ferrous sulfate 325 (65 FE) MG EC tablet Take 325 mg by mouth daily with breakfast.   HAIR/SKIN/NAILS PO Take 1 tablet by mouth daily at 12 noon.   HYDROcodone-acetaminophen 5-325 MG tablet Commonly known as: NORCO/VICODIN Take 1-2 tablets by mouth every 4 (four) hours as needed for moderate pain (pain score 4-6).   lisinopril 20 MG tablet Commonly known as: ZESTRIL Take 20 mg by mouth daily.   metoprolol tartrate 50 MG tablet Commonly known as: LOPRESSOR Take 50 mg by mouth 2 (two) times daily.   multivitamin tablet Take 1 tablet by mouth daily.   naproxen 500 MG tablet Commonly known as: NAPROSYN Take 500 mg by mouth every morning.   omeprazole 20 MG capsule Commonly known as: PRILOSEC Take 20 mg by mouth daily.   ondansetron 4 MG tablet Commonly known as: ZOFRAN Take 1 tablet (4 mg total) by mouth every 6 (six) hours as needed for nausea.   pravastatin 40 MG tablet Commonly known as: PRAVACHOL Take 40 mg by mouth daily.   traMADol 50 MG tablet Commonly known as: ULTRAM Take 1 tablet (50 mg total) by mouth every 6 (six) hours as needed for moderate pain.            Durable Medical Equipment  (From admission, onward)         Start     Ordered    07/02/20 1614  DME Bedside commode  Once       Question:  Patient needs a bedside commode to treat with the following condition  Answer:  Status post total knee replacement using cement, right   07/02/20 1613   07/02/20 1614  DME 3 n 1  Once        07/02/20  1613   07/02/20 1614  DME Walker rolling  Once       Question Answer Comment  Walker: With 5 Inch Wheels   Patient needs a walker to treat with the following condition Status post total knee replacement using cement, right      07/02/20 1613          Diagnostic Studies: DG Knee Right Port  Result Date: 07/02/2020 CLINICAL DATA:  Right knee arthroplasty EXAM: PORTABLE RIGHT KNEE - 1-2 VIEW COMPARISON:  06/16/2012 FINDINGS: Interval postsurgical changes from right total knee arthroplasty. Arthroplasty components are in their expected alignment without evidence of periprosthetic fracture or other postoperative complication. Expected postoperative changes within the overlying soft tissues. IMPRESSION: Satisfactory postoperative appearance status post right total knee arthroplasty. Electronically Signed   By: Davina Poke D.O.   On: 07/02/2020 15:02   Disposition: Plan for discharge home today with home health physical therapy.   Follow-up Information    Lattie Corns, PA-C Follow up in 14 day(s).   Specialty: Physician Assistant Why: Electa Sniff information: [place] [address] [phone]              Signed: Judson Roch PA-C 07/03/2020, 4:13 PM

## 2020-07-03 NOTE — TOC Initial Note (Signed)
Transition of Care [facility]) - Initial/Assessment Note    Patient Details  Name: Hannah Davila MRN: [id number] Date of Birth: Jun 18, 1946  [facility] CM/SW Contact:    Shelbie Ammons, RN Phone Number: 07/03/2020, 4:24 PM  Clinical Narrative:   RNCM spoke with patient by telephone for discharge planning. Patient is agreeable to having home health arranged she reports she has all necessary equipment in the home. She has had home health in the past but does not remember with who, she is agreeable to whoever is in network with her insurance. RNCM reached out to [place] with [facility] and [place] with [facility] but they do not provide service in [place] reached out to [place] with [facility] and he accepted referral for PT.                 Expected Discharge Plan: [place] Barriers to Discharge: No Barriers Identified   Patient Goals and CMS Choice        Expected Discharge Plan and Services Expected Discharge Plan: [place]       Living arrangements for the past 2 months: Single Family Home Expected Discharge Date: 07/03/20                         HH Arranged: PT HH Agency: [facility] Date [place]: 07/03/20 Time HH Agency Contacted: 1622 Representative spoke with at [place]: Tommi Rumps  Prior Living Arrangements/Services Living arrangements for the past 2 months: [place]   Patient language and need for interpreter reviewed:: Yes Do you feel safe going back to the place where you live?: Yes      Need for Family Participation in Patient Care: Yes (Comment) Care giver support system in place?: Yes (comment)   Criminal Activity/Legal Involvement Pertinent to Current Situation/Hospitalization: No - Comment as needed  Activities of Daily Living Home Assistive Devices/Equipment: Cane (specify quad or straight),Walker (specify type),Eyeglasses,Blood pressure cuff,Shower chair with back ADL  Screening (condition at time of admission) Patient's cognitive ability adequate to safely complete daily activities?: Yes Is the patient deaf or have difficulty hearing?: No Does the patient have difficulty seeing, even when wearing glasses/contacts?: No Does the patient have difficulty concentrating, remembering, or making decisions?: No Patient able to express need for assistance with ADLs?: No Does the patient have difficulty dressing or bathing?: No Independently performs ADLs?: Yes (appropriate for developmental age) Does the patient have difficulty walking or climbing stairs?: Yes Weakness of Legs: Both Weakness of Arms/Hands: None  Permission Sought/Granted                  Emotional Assessment              Admission diagnosis:  Status post total knee replacement using cement, right [Z96.651] Patient Active Problem List   Diagnosis Date Noted  . Status post total knee replacement using cement, 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   PCP:  Verita Lamb, NP Pharmacy:   [id number] - [place], [place] [address] [place] [address] Phone: [phone] Fax: [phone]     Social Determinants of Health (SDOH) Interventions    Readmission Risk Interventions No flowsheet data found.

## 2020-07-03 NOTE — Discharge Instructions (Signed)
Diet: As you were doing prior to hospitalization   Shower:  May shower but keep the wounds dry, use an occlusive plastic wrap, NO SOAKING IN TUB.  If the bandage gets wet, change with a clean dry gauze.  Dressing:  You may change your dressing as needed. Change the dressing with sterile gauze dressing.    Activity:  Increase activity slowly as tolerated, but follow the weight bearing instructions below.  No lifting or driving for 6 weeks.  Weight Bearing:   Weight bearing as tolerated to right lower extremity  Blood Clot Prevention: Take Eliquis twice daily for 14 days.  To prevent constipation: you may use a stool softener such as -  Colace (over the counter) 100 mg by mouth twice a day  Drink plenty of fluids (prune juice may be helpful) and high fiber foods Miralax (over the counter) for constipation as needed.    Itching:  If you experience itching with your medications, try taking only a single pain pill, or even half a pain pill at a time.  You may take up to 10 pain pills per day, and you can also use benadryl over the counter for itching or also to help with sleep.   Precautions:  If you experience chest pain or shortness of breath - call 911 immediately for transfer to the hospital emergency department!!  If you develop a fever greater that 101 F, purulent drainage from wound, increased redness or drainage from wound, or calf pain-Call [place]                                              Follow- Up Appointment:  Please call for an appointment to be seen in 2 weeks at [facility]

## 2020-07-03 NOTE — Evaluation (Signed)
Physical Therapy Evaluation Patient Details Name: Hannah Davila MRN: [id number] DOB: [birth year] Today's Date: 07/03/2020   History of Present Illness  Patient is a 75 y/o female s/p R TKA on 07/02/20 due to degenerative joint. PMH: OA, asthma, HTN.  Clinical Impression  Patient received in the bed with nurse in room assess vitals.  Completed supine to sit transfer and sit to stand tansfer with min A, verbal cues required for hand placement and appropriate RW management.  Upon arrival BP: 119/55, when seated EOB BP: 124/72. Completed ambulation for 30 ft with minA using RW, verbal cues provided to patient for pursed lip breathing for pain management. Completed seated exercises including: quad sets x5 (5 second hold) tactile and verbal cues required for appropriate quad activation, ankle pumps x20. Patient demonstrated increase in pain to 6/10 following ambulation, nurse notified of patient's request for pain medication following PT. Overall patient tolerated intervetions well today, with some noted increase in pain, fatigue and SOB.  Goal is to assess her ability to complete 1 step later today for potential discharge.     Follow Up Recommendations Home health PT    Equipment Recommendations       Recommendations for Other Services       Precautions / Restrictions Precautions Precautions: Fall Precaution Comments: WBAT Restrictions Weight Bearing Restrictions: Yes RLE Weight Bearing: Weight bearing as tolerated      Mobility  Bed Mobility Overal bed mobility: Needs Assistance Bed Mobility: Rolling;Supine to Sit Rolling: Supervision   Supine to sit: Min guard     General bed mobility comments: Pt able to complete bed mobility with CGA, verbal cues provided to patient for hand placement    Transfers Overall transfer level: Needs assistance Equipment used: Standard walker Transfers: Sit to/from Stand Sit to Stand: Min assist         General transfer comment: Patient  provided verbal cues for hand placement and pursed lip breathing to complete transfer safely  Ambulation/Gait Ambulation/Gait assistance: Min assist Gait Distance (Feet): 30 Feet Assistive device: Standard walker Gait Pattern/deviations: Decreased stance time - right;Decreased weight shift to right;Antalgic;Step-through pattern     General Gait Details: Pt with decreased DF on the R LE demos antalgic gait requiring VCs for posture and pursed lip breathing to maintain pain  Stairs                 Balance Overall balance assessment: Needs assistance Sitting-balance support: Bilateral upper extremity supported   Sitting balance - Comments: Pt safe seated EOB with SBA and BIL UE support   Standing balance support: Bilateral upper extremity supported   Standing balance comment: Patient requries min A to maintain balance safely in standing position associated with pain and R LE strength deficits                             Pertinent Vitals/Pain Pain Assessment: 0-10 Pain Score: 5  Pain Location: R knee Pain Descriptors / Indicators: Aching;Throbbing Pain Intervention(s): Patient requesting pain meds-RN notified;Ice applied;Monitored during session;Utilized relaxation techniques (Verbal and visual cues provided to pt to complete pursed lip breathing throughout interventions to help reduce pain)    Home Living Family/patient expects to be discharged to:: Private residence Living Arrangements: Alone Available Help at Discharge: Family Type of Home: House Home Access: Stairs to enter Entrance Stairs-Rails: None Entrance Stairs-Number of Steps: 1 through back entrance, 2 through the front Home Layout: One level Home Equipment: Gilford Rile -  2 wheels;Cane - single point      Prior Function Level of Independence: Independent         Comments: Pt completing ADLS prior to surgery with mod I        Extremity/Trunk Assessment        Lower Extremity  Assessment Lower Extremity Assessment: RLE deficits/detail RLE Deficits / Details: Decreased ROM and strength associated with post op TKA RLE: Unable to fully assess due to pain RLE Coordination: decreased gross motor    Cervical / Trunk Assessment Cervical / Trunk Assessment: Normal  Communication   Communication: No difficulties  Cognition Arousal/Alertness: Awake/alert Behavior During Therapy: WFL for tasks assessed/performed Overall Cognitive Status: Within Functional Limits for tasks assessed                                 General Comments: Pt alert and agreeable to treatment today      General Comments General comments (skin integrity, edema, etc.): Moderate edema noted at R knee    Exercises Total Joint Exercises Ankle Circles/Pumps: AROM;20 reps Quad Sets: AROM;5 reps (5 second hold)   Assessment/Plan    PT Assessment Patient needs continued PT services  PT Problem List Decreased strength;Decreased mobility;Decreased range of motion;Decreased coordination;Decreased activity tolerance;Decreased balance;Pain       PT Treatment Interventions DME instruction;Gait training;Stair training;Therapeutic exercise    PT Goals (Current goals can be found in the Care Plan section)  Acute Rehab PT Goals Patient Stated Goal: Return home, walk without pain and return to her gardening PT Goal Formulation: With patient Time For Goal Achievement: 07/17/20 Potential to Achieve Goals: Good    Frequency BID   Barriers to discharge   Patient with no railing to enter, but only 1 step       AM-PAC PT "6 Clicks" Mobility  Outcome Measure Help needed turning from your back to your side while in a flat bed without using bedrails?: A Little Help needed moving from lying on your back to sitting on the side of a flat bed without using bedrails?: A Little Help needed moving to and from a bed to a chair (including a wheelchair)?: A Little Help needed standing up from a  chair using your arms (e.g., wheelchair or bedside chair)?: A Little Help needed to walk in hospital room?: A Little Help needed climbing 3-5 steps with a railing? : A Lot 6 Click Score: 17    End of Session Equipment Utilized During Treatment: Gait belt Activity Tolerance: Patient limited by pain;Patient limited by fatigue Patient left: in chair;with call bell/phone within reach;with nursing/sitter in room Nurse Communication: Mobility status;Patient requests pain meds PT Visit Diagnosis: Unsteadiness on feet (R26.81);Other abnormalities of gait and mobility (R26.89);Muscle weakness (generalized) (M62.81)    Time: 2952-8413 PT Time Calculation (min) (ACUTE ONLY): 31 min   Charges:   PT Evaluation $PT Eval Low Complexity: 1 Low PT Treatments $Gait Training: 8-22 mins $Therapeutic Activity: 8-22 mins        Duanne Guess, PT, DPT 07/03/20, 10:24 AM   Isaias Cowman 07/03/2020, 10:18 AM

## 2020-07-03 NOTE — OR Nursing (Signed)
Pt resting comfortably in bed, appears to be sleeping, when 6am meds given denied pain. Hr remains in the 77s. Foley remains patent

## 2020-07-03 NOTE — Anesthesia Postprocedure Evaluation (Signed)
Anesthesia Post Note  Patient: Hannah Davila  Procedure(s) Performed: TOTAL KNEE ARTHROPLASTY (Right Knee)  Patient location during evaluation: Nursing Unit (Pre-Op 20) Anesthesia Type: Spinal Level of consciousness: oriented and awake and alert Pain management: pain level controlled Vital Signs Assessment: post-procedure vital signs reviewed and stable Respiratory status: spontaneous breathing and respiratory function stable Cardiovascular status: blood pressure returned to baseline and stable Postop Assessment: no headache, no backache, no apparent nausea or vomiting and patient able to bend at knees Anesthetic complications: no   No complications documented.   Last Vitals:  Vitals:   07/03/20 0500 07/03/20 0600  BP:    Pulse: (!) 49 (!) 54  Resp:    Temp:    SpO2: 95% 98%    Last Pain:  Vitals:   07/03/20 0600  TempSrc:   PainSc: 0-No pain                 [name] Lily Peer

## 2020-07-03 NOTE — Progress Notes (Signed)
Physical Therapy Treatment Patient Details Name: Hannah Davila MRN: [id number] DOB: Feb 13, 1946 Today's Date: 07/03/2020    History of Present Illness Patient is a 75 y/o female s/p R TKA on 07/02/20 due to degenerative joint. PMH: OA, asthma, HTN.    PT Comments    Patient received while using BSC, willing to participate with physical therapy. Patient reported 4/10 pain prior to completing interventions. Pt stated that she has family that will be coming to assist her home when she is discharged from the hospital. Pt completed sit<>stand transfer with minA and RW. Pt demonstrated toilet hygiene with CGA for safety. Pt ambulated with an antalgic gait 60 ft with WC follow. Therapeutic exercise completed seated in chair: LAQs x10. AROM R knee: -10-84. Pt negotiated 2 steps with minA using RW with VCs provided to patient for RW management and step pattern. Reported pain increased to 7/10 following interventions today. Patient left seated in her chair, brake locked with call bell.   Follow Up Recommendations  Home health PT           Precautions / Restrictions Precautions Precautions: Fall Precaution Comments: WBAT Restrictions RLE Weight Bearing: Weight bearing as tolerated    Mobility   Transfers Overall transfer level: Needs assistance Equipment used: Standard walker Transfers: Sit to/from Stand Sit to Stand: Min assist         General transfer comment: Patient provided verbal cues for hand placement and pursed lip breathing to complete transfer safely  Ambulation/Gait Ambulation/Gait assistance: Min assist Gait Distance (Feet): 50 Feet Assistive device: Standard walker Gait Pattern/deviations: Decreased stance time - right;Decreased weight shift to right;Antalgic;Step-through pattern Gait velocity: Reduced   General Gait Details: Pt with decreased DF on the R LE demos antalgic gait requiring VCs for posture and pursed lip breathing to maintain pain   Stairs Stairs:  Yes Stairs assistance: Min assist Stair Management: Step to pattern;With walker Number of Stairs: 2 General stair comments: Pt demonstrated understanding of appropriate stair negotion in order to safely enter her home through her carport, complete 2 steps to enter using RW and assistance from caregiver for safety          Balance Overall balance assessment: Needs assistance Sitting-balance support: Bilateral upper extremity supported   Sitting balance - Comments: Pt safe seated EOB with SBA and BIL UE support   Standing balance support: Bilateral upper extremity supported   Standing balance comment: Patient requries min A to maintain balance safely in standing position associated with pain and R LE strength deficits                            Cognition Arousal/Alertness: Awake/alert Behavior During Therapy: WFL for tasks assessed/performed Overall Cognitive Status: Within Functional Limits for tasks assessed            General Comments: Pt alert and agreeable to treatment today      Exercises Total Joint Exercises Long Arc Quad: AROM;10 reps (She required a rest break after 5 repetitions) Goniometric ROM: -10-84    General Comments General comments (skin integrity, edema, etc.): Moderate edema continues to be present in the R knee      Pertinent Vitals/Pain Pain Assessment: 0-10 Pain Score: 4  Pain Location: R knee Pain Descriptors / Indicators: Aching;Throbbing Pain Intervention(s): Limited activity within patient's tolerance;Premedicated before session    Home Living  Prior Function            PT Goals (current goals can now be found in the care plan section) Acute Rehab PT Goals Patient Stated Goal: Return home, walk without pain and return to her gardening PT Goal Formulation: With patient Time For Goal Achievement: 07/17/20 Potential to Achieve Goals: Good Progress towards PT goals: Progressing toward goals     Frequency           PT Plan Current plan remains appropriate    Co-evaluation              AM-PAC PT "6 Clicks" Mobility   Outcome Measure  Help needed turning from your back to your side while in a flat bed without using bedrails?: None Help needed moving from lying on your back to sitting on the side of a flat bed without using bedrails?: A Little Help needed moving to and from a bed to a chair (including a wheelchair)?: A Little Help needed standing up from a chair using your arms (e.g., wheelchair or bedside chair)?: A Little Help needed to walk in hospital room?: A Little Help needed climbing 3-5 steps with a railing? : A Lot 6 Click Score: 18    End of Session Equipment Utilized During Treatment: Gait belt Activity Tolerance: Patient limited by pain;Patient limited by fatigue Patient left: in chair;with call bell/phone within reach Nurse Communication: Mobility status;Other (comment) (Discussed with nurse pt's ability to complete stairs as required to enter home and potential for discharge to home with HHPT) PT Visit Diagnosis: Unsteadiness on feet (R26.81);Other abnormalities of gait and mobility (R26.89);Muscle weakness (generalized) (M62.81)     Time: 5916-3846 PT Time Calculation (min) (ACUTE ONLY): 30 min  Charges:  $Gait Training: 8-22 mins $Therapeutic Activity: 8-22 mins                     Duanne Guess, PT, DPT 07/03/20, 3:32 PM    Puneet Masoner Malcolm [place] 07/03/2020, 3:07 PM

## 2020-07-03 NOTE — Plan of Care (Signed)
Pt progressing toward functional goals

## 2020-07-10 ENCOUNTER — Emergency Department: Payer: Medicare PPO

## 2020-07-10 ENCOUNTER — Other Ambulatory Visit: Payer: Self-pay

## 2020-07-10 ENCOUNTER — Emergency Department
Admission: EM | Admit: 2020-07-10 | Discharge: 2020-07-10 | Disposition: A | Discharge status: Home / Self Care | Payer: Medicare PPO | Attending: Emergency Medicine | Admitting: Emergency Medicine

## 2020-07-10 DIAGNOSIS — M79606 Pain in leg, unspecified: Secondary | ICD-10-CM

## 2020-07-10 DIAGNOSIS — M25561 Pain in right knee: Secondary | ICD-10-CM | POA: Insufficient documentation

## 2020-07-10 DIAGNOSIS — Z7982 Long term (current) use of aspirin: Secondary | ICD-10-CM | POA: Insufficient documentation

## 2020-07-10 DIAGNOSIS — J45909 Unspecified asthma, uncomplicated: Secondary | ICD-10-CM | POA: Diagnosis not present

## 2020-07-10 DIAGNOSIS — Z79899 Other long term (current) drug therapy: Secondary | ICD-10-CM | POA: Insufficient documentation

## 2020-07-10 DIAGNOSIS — K5900 Constipation, unspecified: Secondary | ICD-10-CM | POA: Diagnosis not present

## 2020-07-10 DIAGNOSIS — I10 Essential (primary) hypertension: Secondary | ICD-10-CM | POA: Insufficient documentation

## 2020-07-10 DIAGNOSIS — R110 Nausea: Secondary | ICD-10-CM | POA: Diagnosis not present

## 2020-07-10 DIAGNOSIS — Z7901 Long term (current) use of anticoagulants: Secondary | ICD-10-CM | POA: Insufficient documentation

## 2020-07-10 DIAGNOSIS — Z96651 Presence of right artificial knee joint: Secondary | ICD-10-CM | POA: Insufficient documentation

## 2020-07-10 DIAGNOSIS — M79604 Pain in right leg: Secondary | ICD-10-CM

## 2020-07-10 LAB — COMPREHENSIVE METABOLIC PANEL
ALT: 30 U/L (ref 0–44)
AST: 30 U/L (ref 15–41)
Albumin: 3.7 g/dL (ref 3.5–5.0)
Alkaline Phosphatase: 81 U/L (ref 38–126)
Anion gap: 13 (ref 5–15)
BUN: 19 mg/dL (ref 8–23)
CO2: 26 mmol/L (ref 22–32)
Calcium: 8.9 mg/dL (ref 8.9–10.3)
Chloride: 101 mmol/L (ref 98–111)
Creatinine, Ser: 0.79 mg/dL (ref 0.44–1.00)
GFR, Estimated: >60 mL/min (ref >60)
Glucose, Bld: 116 mg/dL — ABNORMAL HIGH (ref 70–99)
Potassium: 3.7 mmol/L (ref 3.5–5.1)
Sodium: 140 mmol/L (ref 135–145)
Total Bilirubin: 1 mg/dL (ref 0.3–1.2)
Total Protein: 7.3 g/dL (ref 6.5–8.1)

## 2020-07-10 LAB — CBC WITH DIFFERENTIAL/PLATELET
Abs Immature Granulocytes: 0.06 10*3/uL (ref 0.00–0.07)
Basophils Absolute: 0 10*3/uL (ref 0.0–0.1)
Basophils Relative: 1 %
Eosinophils Absolute: 0.1 10*3/uL (ref 0.0–0.5)
Eosinophils Relative: 1 %
HCT: 32.6 % — ABNORMAL LOW (ref 36.0–46.0)
Hemoglobin: 10.2 g/dL — ABNORMAL LOW (ref 12.0–15.0)
Immature Granulocytes: 1 %
Lymphocytes Relative: 28 %
Lymphs Abs: 1.7 10*3/uL (ref 0.7–4.0)
MCH: 28.5 pg (ref 26.0–34.0)
MCHC: 31.3 g/dL (ref 30.0–36.0)
MCV: 91.1 fL (ref 80.0–100.0)
Monocytes Absolute: 0.6 10*3/uL (ref 0.1–1.0)
Monocytes Relative: 11 %
Neutro Abs: 3.6 10*3/uL (ref 1.7–7.7)
Neutrophils Relative %: 58 %
Platelets: 271 10*3/uL (ref 150–400)
RBC: 3.58 MIL/uL — ABNORMAL LOW (ref 3.87–5.11)
RDW: 12.5 % (ref 11.5–15.5)
WBC: 6.1 10*3/uL (ref 4.0–10.5)
nRBC: 0.7 % — ABNORMAL HIGH (ref 0.0–0.2)

## 2020-07-10 LAB — URINALYSIS, COMPLETE (UACMP) WITH MICROSCOPIC
Bacteria, UA: NONE SEEN
Bilirubin Urine: NEGATIVE
Glucose, UA: NEGATIVE mg/dL
Hgb urine dipstick: NEGATIVE
Ketones, ur: 20 mg/dL — AB
Nitrite: NEGATIVE
Protein, ur: NEGATIVE mg/dL
Specific Gravity, Urine: 1.018 (ref 1.005–1.030)
pH: 6 (ref 5.0–8.0)

## 2020-07-10 MED ORDER — METOCLOPRAMIDE HCL 10 MG PO TABS: 10.0000 mg | ORAL_TABLET | Freq: Three times a day (TID) | ORAL | 1 refills | Status: DC | PRN

## 2020-07-10 MED ORDER — ONDANSETRON 4 MG PO TBDP
4.0000 mg | ORAL_TABLET | Freq: Once | ORAL | Status: AC
  Administered 2020-07-10: 4 mg via ORAL
  Filled 2020-07-10: qty 1

## 2020-07-10 MED ORDER — METOCLOPRAMIDE HCL 5 MG/ML IJ SOLN
10.0000 mg | Freq: Once | INTRAMUSCULAR | Status: AC
  Administered 2020-07-10: 10 mg via INTRAMUSCULAR
  Filled 2020-07-10: qty 2

## 2020-07-10 MED ORDER — METOCLOPRAMIDE HCL 10 MG PO TABS: 10.0000 mg | ORAL_TABLET | Freq: Three times a day (TID) | ORAL | 1 refills | Status: AC | PRN

## 2020-07-10 NOTE — Discharge Instructions (Signed)
You can take Reglan up to three times daily for nausea.  Continue taking MiraLAX as a stool softener while taking hydrocodone. Drink plenty of water in order to stay hydrated. You can take the Reglan at the same time as you take Zofran for nausea.

## 2020-07-10 NOTE — ED Notes (Signed)
Pt given gingerale with verbal okay from [place], [place].

## 2020-07-10 NOTE — ED Provider Notes (Signed)
[facility]-EMERGENCY DEPARTMENT  ____________________________________________  Time seen: Approximately 4:46 PM  I have reviewed the triage vital signs and the nursing notes.   HISTORY  Chief Complaint Knee Pain and Post-op Problem   Historian Patient     HPI Hannah Davila is a 75 y.o. female status post right total knee replacement on 07/02/2020, presents to the emergency department due to nausea, abdominal discomfort and worsening right knee pain.  Patient is unsure if right knee pain is making her nauseated.  She states that she has had 1 bowel movement in the past 5 days which occurred last night.  She states that her abdominal discomfort did not improve after bowel movement.  She states that her physical therapist noted some increased right knee swelling that they were concerned about.  Patient states that she has been vomiting at home and is unable to maintain p.o. intake.  She denies chest pain, chest tightness or shortness of breath.  No fever or chills at home.  No other alleviating measures have been attempted.   Past Medical History:  Diagnosis Date  . Abnormal barium swallow   . Allergic genetic state   . Arthritis    OSTEOARTHRITIS  . Asthma   . Cataract cortical, senile   . Chest pain   . Complication of anesthesia   . Dyspnea   . Epilepsy ([place])   . GERD (gastroesophageal reflux disease)   . History of colon polyps   . History of Helicobacter pylori infection   . History of hematuria   . History of hiatal hernia   . Hypercholesterolemia   . Hypertension   . PONV (postoperative nausea and vomiting)   . Syncopal episodes      Immunizations up to date:  Yes.     Past Medical History:  Diagnosis Date  . Abnormal barium swallow   . Allergic genetic state   . Arthritis    OSTEOARTHRITIS  . Asthma   . Cataract cortical, senile   . Chest pain   . Complication of anesthesia   . Dyspnea   . Epilepsy ([place])   . GERD (gastroesophageal reflux disease)    . History of colon polyps   . History of Helicobacter pylori infection   . History of hematuria   . History of hiatal hernia   . Hypercholesterolemia   . Hypertension   . PONV (postoperative nausea and vomiting)   . Syncopal episodes     Patient Active Problem List   Diagnosis Date Noted  . 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 ABDOMINAL HYSTERECTOMY    . APPENDECTOMY    . COLONOSCOPY    . COLONOSCOPY WITH PROPOFOL N/A 06/25/2018   Procedure: COLONOSCOPY WITH PROPOFOL;  Surgeon: Manya Silvas, MD;  Location: [facility] ENDOSCOPY;  Service: Endoscopy;  Laterality: N/A;  . ESOPHAGOGASTRODUODENOSCOPY    . ESOPHAGOGASTRODUODENOSCOPY (EGD) WITH PROPOFOL N/A 06/25/2018   Procedure: ESOPHAGOGASTRODUODENOSCOPY (EGD) WITH PROPOFOL;  Surgeon: Manya Silvas, MD;  Location: [facility] ENDOSCOPY;  Service: Endoscopy;  Laterality: N/A;  . JOINT REPLACEMENT Left 03/26/2018  . REMOVAL WISDOM TEETH    . RT.ARM SURGERY     [place]IGN [place]  . TOTAL KNEE ARTHROPLASTY Right 07/02/2020   Procedure: TOTAL KNEE ARTHROPLASTY;  Surgeon: Corky Mull, MD;  Location: [facility] ORS;  Service: Orthopedics;  Laterality: Right;    Prior to Admission medications   Medication Sig Start Date End  Date Taking? Authorizing Provider  metoCLOPramide (REGLAN) 10 MG tablet Take 1 tablet (10 mg total) by mouth every 8 (eight) hours as needed for up to 5 days for nausea. 07/10/20 07/15/20 Yes Vallarie Mare M, PA-C  acetaminophen (TYLENOL) 500 MG tablet Take 500 mg by mouth at bedtime.    [provider]  albuterol (PROVENTIL HFA;VENTOLIN HFA) 108 (90 Base) MCG/ACT inhaler Inhale 2 puffs into the lungs every 6 (six) hours as needed for wheezing or shortness of breath.    [provider]  amLODipine (NORVASC) 5 MG tablet Take 5 mg by mouth daily. 06/02/20   [provider]  apixaban (ELIQUIS) 2.5 MG TABS tablet  Take 1 tablet (2.5 mg total) by mouth 2 (two) times daily. 07/03/20   Lattie Corns, PA-C  aspirin EC 81 MG tablet Take 81 mg by mouth daily.    [provider]  Biotin w/ Vitamins C & E (HAIR/SKIN/NAILS PO) Take 1 tablet by mouth daily at 12 noon.    [provider]  diclofenac Sodium (VOLTAREN) 1 % GEL Apply 2 g topically daily as needed (pain).    [provider]  ferrous sulfate 325 (65 FE) MG EC tablet Take 325 mg by mouth daily with breakfast.    [provider]  HYDROcodone-acetaminophen (NORCO/VICODIN) 5-325 MG tablet Take 1-2 tablets by mouth every 4 (four) hours as needed for moderate pain (pain score 4-6). 07/03/20   Lattie Corns, PA-C  lisinopril (PRINIVIL,ZESTRIL) 20 MG tablet Take 20 mg by mouth daily.    [provider]  metoprolol tartrate (LOPRESSOR) 50 MG tablet Take 50 mg by mouth 2 (two) times daily. 04/10/20   [provider]  Multiple Vitamin (MULTIVITAMIN) tablet Take 1 tablet by mouth daily.    [provider]  naproxen (NAPROSYN) 500 MG tablet Take 500 mg by mouth every morning. 06/12/20   [provider]  omeprazole (PRILOSEC) 20 MG capsule Take 20 mg by mouth daily.    [provider]  ondansetron (ZOFRAN) 4 MG tablet Take 1 tablet (4 mg total) by mouth every 6 (six) hours as needed for nausea. 07/03/20   Lattie Corns, PA-C  pravastatin (PRAVACHOL) 40 MG tablet Take 40 mg by mouth daily.    [provider]  traMADol (ULTRAM) 50 MG tablet Take 1 tablet (50 mg total) by mouth every 6 (six) hours as needed for moderate pain. 07/03/20   Lattie Corns, PA-C    Allergies Oxycodone  Family History  Problem Relation Age of Onset  . Breast cancer Sister     Social History Social History   Tobacco Use  . Smoking status: Never Smoker  . Smokeless tobacco: Never Used  Vaping Use  . Vaping Use: Never used  Substance Use Topics  . Alcohol use: Never  . Drug use:  Never     Review of Systems  Constitutional: No fever/chills Eyes:  No discharge ENT: No upper respiratory complaints. Respiratory: no cough. No SOB/ use of accessory muscles to breath Gastrointestinal:   No nausea, no vomiting.  No diarrhea.  No constipation. Musculoskeletal: Patient has right knee pain.  Skin: Negative for rash, abrasions, lacerations, ecchymosis.   ____________________________________________   PHYSICAL EXAM:  VITAL SIGNS: ED Triage Vitals [07/10/20 1344]  Enc Vitals Group     BP 127/75     Pulse Rate (!) 106     Resp 16     Temp 99.1 F (37.3 C)  Temp Source Oral     SpO2 98 %     Weight 216 lb 0.8 oz (98 kg)     Height 5\' 4"  (1.626 m)     Head Circumference      Peak Flow      Pain Score 9     Pain Loc      Pain Edu?      Excl. in [place]?      Constitutional: Alert and oriented. Well appearing and in no acute distress. Eyes: Conjunctivae are normal. PERRL. EOMI. Head: Atraumatic. Cardiovascular: Normal rate, regular rhythm. Normal S1 and S2.  Good peripheral circulation. Respiratory: Normal respiratory effort without tachypnea or retractions. Lungs CTAB. Good air entry to the bases with no decreased or absent breath sounds Gastrointestinal: Bowel sounds x 4 quadrants. Soft and nontender to palpation. No guarding or rigidity. No distention. Musculoskeletal: Full range of motion to all extremities. No obvious deformities noted. Palpable dorsalis pedis pulse bilaterally and symmetrically. Capillary refill less than 2 seconds on the right.  Neurologic:  Normal for age. No gross focal neurologic deficits are appreciated.  Skin:  Skin is warm, dry and intact. No rash noted. Psychiatric: Mood and affect are normal for age. Speech and behavior are normal.   ____________________________________________   LABS (all labs ordered are listed, but only abnormal results are displayed)  Labs Reviewed  COMPREHENSIVE METABOLIC PANEL - Abnormal; Notable for  the following components:      Result Value   Glucose, Bld 116 (*)    All other components within normal limits  CBC WITH DIFFERENTIAL/PLATELET - Abnormal; Notable for the following components:   RBC 3.58 (*)    Hemoglobin 10.2 (*)    HCT 32.6 (*)    nRBC 0.7 (*)    All other components within normal limits  URINALYSIS, COMPLETE (UACMP) WITH MICROSCOPIC - Abnormal; Notable for the following components:   Color, Urine YELLOW (*)    APPearance HAZY (*)    Ketones, ur 20 (*)    Leukocytes,Ua TRACE (*)    All other components within normal limits   ____________________________________________  EKG   ____________________________________________  RADIOLOGY Unk Pinto, personally viewed and evaluated these images (plain radiographs) as part of my medical decision making, as well as reviewing the written report by the radiologist.    DG Abdomen 1 View  Result Date: 07/10/2020 CLINICAL DATA:  Abdominal discomfort. EXAM: ABDOMEN - 1 VIEW COMPARISON:  None. FINDINGS: The bowel gas pattern is normal. No radio-opaque calculi or other significant radiographic abnormality are seen. Multiple phleboliths are seen within the pelvis. IMPRESSION: Negative. Electronically Signed   By: Virgina Norfolk M.D.   On: 07/10/2020 17:20   US Venous Img Lower Unilateral Right (DVT)  Result Date: 07/10/2020 CLINICAL DATA:  Right leg pain/swelling. EXAM: RIGHT LOWER EXTREMITY VENOUS DOPPLER ULTRASOUND TECHNIQUE: Gray-scale sonography with compression, as well as color and duplex ultrasound, were performed to evaluate the deep venous system(s) from the level of the common femoral vein through the popliteal and proximal calf veins. COMPARISON:  None. FINDINGS: VENOUS Normal compressibility of the common femoral, superficial femoral, and popliteal veins, as well as the visualized calf veins. Visualized portions of profunda femoral vein and great saphenous vein unremarkable. No filling defects to suggest DVT  on grayscale or color Doppler imaging. Doppler waveforms show normal direction of venous flow, normal respiratory plasticity and response to augmentation. Limited views of the contralateral common femoral vein are unremarkable. OTHER Nonspecific calf edema. Limitations:  Limited evaluation of the posterior tibial and peroneal veins due to edema. IMPRESSION: No evidence of DVT. Electronically Signed   By: Margaretha Sheffield MD   On: 07/10/2020 17:48    ____________________________________________    PROCEDURES  Procedure(s) performed:     Procedures     Medications  ondansetron (ZOFRAN-ODT) disintegrating tablet 4 mg (4 mg Oral Given 07/10/20 1651)  metoCLOPramide (REGLAN) injection 10 mg (10 mg Intramuscular Given 07/10/20 1652)     ____________________________________________   INITIAL IMPRESSION / ASSESSMENT AND PLAN / ED COURSE  Pertinent labs & imaging results that were available during my care of the patient were reviewed by me and considered in my medical decision making (see chart for details).     Assessment and Plan:  Abdominal discomfort:  Right knee pain:  75 year old female presents to the emergency department with worsening right knee swelling 5 days after a total knee arthroplasty.  Patient was also complaining of nausea and generalized abdominal discomfort.  Venous ultrasound was obtained during this emergency department encounter which showed no signs of DVT.  CBC and CMP were reassuring.  No signs of UTI on urinalysis.  Patient felt much improved after Zofran and Reglan.  Prescribe patient a short course of oral Reglan to help with abdominal discomfort and nausea likely secondary to constipation.  KUB shows no signs of obstruction.  Highly suspicious for residual ileus given narcotic usage.  Recommended staying hydrated at home and to reduce pain medicine as tolerated and directed by orthopedics to help with GI motility.  Patient is accompanied by her daughter  and has help at home with stool softener and enema usage.  Patient was able to pass a p.o. challenge prior to being discharged from the emergency department.  Return precautions were given to return with new or worsening symptoms.  All patient questions were answered.     ____________________________________________  FINAL CLINICAL IMPRESSION(S) / ED DIAGNOSES  Final diagnoses:  Nausea  Constipation, unspecified constipation type      NEW MEDICATIONS STARTED DURING THIS VISIT:  ED Discharge Orders         Ordered    metoCLOPramide (REGLAN) 10 MG tablet  Every 8 hours PRN        07/10/20 1931              This chart was dictated using voice recognition software/Dragon. Despite best efforts to proofread, errors can occur which can change the meaning. Any change was purely unintentional.     Karren Cobble 07/10/20 1939    Carrie Mew, MD 07/10/20 2314

## 2020-07-10 NOTE — ED Triage Notes (Addendum)
Reports right knee replacement X 5 days ago. Poor PO intake and minimal BM since. Pt states she is unable to take her home meds due to pain and emesis after PO intake.

## 2020-07-10 NOTE — ED Notes (Signed)
See triage note. Visitor at bedside. Pt reports no large BM since surgery; states only "smears" since then. Reports nausea at times. Pt resting calmly in bed; in NAD. Skin dry. Resp reg/unlabored. Not currently vomiting.

## 2020-07-21 ENCOUNTER — Encounter: Payer: Self-pay | Admitting: Surgery

## 2020-10-19 ENCOUNTER — Other Ambulatory Visit: Payer: Self-pay | Admitting: Family

## 2020-10-19 DIAGNOSIS — Z1231 Encounter for screening mammogram for malignant neoplasm of breast: Secondary | ICD-10-CM

## 2021-01-14 ENCOUNTER — Other Ambulatory Visit: Payer: Self-pay

## 2021-01-14 ENCOUNTER — Ambulatory Visit
Admission: RE | Admit: 2021-01-14 | Discharge: 2021-01-14 | Disposition: A | Discharge status: Home / Self Care | Payer: Medicare PPO | Source: Ambulatory Visit | Attending: Family | Admitting: Family

## 2021-01-14 DIAGNOSIS — Z1231 Encounter for screening mammogram for malignant neoplasm of breast: Secondary | ICD-10-CM | POA: Diagnosis present

## 2021-02-12 ENCOUNTER — Encounter: Payer: Self-pay | Admitting: Emergency Medicine

## 2021-02-12 ENCOUNTER — Ambulatory Visit
Admission: EM | Admit: 2021-02-12 | Discharge: 2021-02-12 | Disposition: A | Discharge status: Home / Self Care | Payer: Medicare PPO | Attending: Sports Medicine | Admitting: Sports Medicine

## 2021-02-12 ENCOUNTER — Telehealth: Payer: Self-pay | Admitting: Sports Medicine

## 2021-02-12 ENCOUNTER — Other Ambulatory Visit: Payer: Self-pay

## 2021-02-12 DIAGNOSIS — T7840XA Allergy, unspecified, initial encounter: Secondary | ICD-10-CM | POA: Diagnosis not present

## 2021-02-12 DIAGNOSIS — H5789 Other specified disorders of eye and adnexa: Secondary | ICD-10-CM | POA: Diagnosis not present

## 2021-02-12 DIAGNOSIS — T63441A Toxic effect of venom of bees, accidental (unintentional), initial encounter: Secondary | ICD-10-CM | POA: Diagnosis not present

## 2021-02-12 MED ORDER — PREDNISONE 10 MG PO TABS: 20.0000 mg | ORAL_TABLET | Freq: Two times a day (BID) | ORAL | 0 refills | Status: AC

## 2021-02-12 MED ORDER — METHYLPREDNISOLONE SODIUM SUCC 40 MG IJ SOLR
80.0000 mg | Freq: Once | INTRAMUSCULAR | Status: AC
  Administered 2021-02-12: 80 mg via INTRAMUSCULAR

## 2021-02-12 MED ORDER — PREDNISONE 10 MG PO TABS: 20.0000 mg | ORAL_TABLET | Freq: Two times a day (BID) | ORAL | 0 refills | Status: DC

## 2021-02-12 NOTE — ED Triage Notes (Signed)
Patient states that she was stung by a bee 2 days ago just below her right eye.  Patient still has redness and swelling under her right eye and on her right facial cheek.

## 2021-02-12 NOTE — ED Provider Notes (Signed)
[facility]    CSN: [id number] Arrival date & time: 02/12/21  1740      History   Chief Complaint Chief Complaint  Patient presents with   Insect Bite    Right eye    HPI Hannah Davila is a 75 y.o. female.   Patient is a pleasant 75 year old female who presents for evaluation of a bee sting below her right eye.  It happened 2 days ago.  She has developed some swelling.  She reports that there may be a little bit of redness as well in her right cheek.  No fever shakes chills.  No nausea vomiting or diarrhea.  She did call her primary care provider, but they could not see her today and they advised that she come to the urgent care.  She does not have any history of significant or severe allergies to bee stings.  She reports no difficulty with her breathing, no wheezing, no shortness of breath, no difficulty swallowing.  No chest pain.  No red flag signs or symptoms elicited on history.   Past Medical History:  Diagnosis Date   Abnormal barium swallow    Allergic genetic state    Arthritis    OSTEOARTHRITIS   Asthma    Cataract cortical, senile    Chest pain    Complication of anesthesia    Dyspnea    Epilepsy ([place])    GERD (gastroesophageal reflux disease)    History of colon polyps    History of Helicobacter pylori infection    History of hematuria    History of hiatal hernia    Hypercholesterolemia    Hypertension    PONV (postoperative nausea and vomiting)    Syncopal episodes     Patient Active Problem List   Diagnosis Date Noted   Status post total knee replacement using cement, 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    Past Surgical History:  Procedure Laterality Date   ABDOMINAL HYSTERECTOMY     APPENDECTOMY     COLONOSCOPY     COLONOSCOPY WITH PROPOFOL N/A 06/25/2018   Procedure: COLONOSCOPY WITH PROPOFOL;  Surgeon: Manya Silvas, MD;  Location: [facility] ENDOSCOPY;  Service: Endoscopy;  Laterality: N/A;   ESOPHAGOGASTRODUODENOSCOPY      ESOPHAGOGASTRODUODENOSCOPY (EGD) WITH PROPOFOL N/A 06/25/2018   Procedure: ESOPHAGOGASTRODUODENOSCOPY (EGD) WITH PROPOFOL;  Surgeon: Manya Silvas, MD;  Location: [facility] ENDOSCOPY;  Service: Endoscopy;  Laterality: N/A;   JOINT REPLACEMENT Left 03/26/2018   REMOVAL WISDOM TEETH     RT.ARM SURGERY     BENIGN MASS AT [place]   TOTAL KNEE ARTHROPLASTY Right 07/02/2020   Procedure: TOTAL KNEE ARTHROPLASTY;  Surgeon: Corky Mull, MD;  Location: [facility] ORS;  Service: Orthopedics;  Laterality: Right;    OB History   No obstetric history on file.      Home Medications    Prior to Admission medications   Medication Sig Start Date End Date Taking? Authorizing Provider  amLODipine (NORVASC) 5 MG tablet Take 5 mg by mouth daily. 06/02/20  Yes [provider]  aspirin EC 81 MG tablet Take 81 mg by mouth daily.   Yes [provider]  Biotin w/ Vitamins C & E (HAIR/SKIN/NAILS PO) Take 1 tablet by mouth daily at 12 noon.   Yes [provider]  lisinopril (PRINIVIL,ZESTRIL) 20 MG tablet Take 20 mg by mouth daily.   Yes [provider]  metoprolol tartrate (LOPRESSOR) 50 MG tablet Take 50 mg by mouth 2 (two) times daily. 04/10/20  Yes [provider]  Multiple Vitamin (MULTIVITAMIN) tablet Take 1 tablet by mouth daily.   Yes [provider]  pravastatin (PRAVACHOL) 40 MG tablet Take 40 mg by mouth daily.   Yes [provider]  acetaminophen (TYLENOL) 500 MG tablet Take 500 mg by mouth at bedtime.    [provider]  albuterol (PROVENTIL HFA;VENTOLIN HFA) 108 (90 Base) MCG/ACT inhaler Inhale 2 puffs into the lungs every 6 (six) hours as needed for wheezing or shortness of breath.    [provider]  apixaban (ELIQUIS) 2.5 MG TABS tablet Take 1 tablet (2.5 mg total) by mouth 2 (two) times daily. 07/03/20   Lattie Corns, PA-C  diclofenac Sodium (VOLTAREN) 1 % GEL Apply 2 g topically daily as needed (pain).    [provider]  ferrous sulfate 325 (65 FE) MG EC tablet Take 325 mg by mouth daily with breakfast.    [provider]  HYDROcodone-acetaminophen (NORCO/VICODIN) 5-325 MG tablet Take 1-2 tablets by mouth every 4 (four) hours as needed for moderate pain (pain score 4-6). 07/03/20   Lattie Corns, PA-C  metoCLOPramide (REGLAN) 10 MG tablet Take 1 tablet (10 mg total) by mouth every 8 (eight) hours as needed for up to 5 days for nausea. 07/10/20 07/15/20  Lannie Fields, PA-C  naproxen (NAPROSYN) 500 MG tablet Take 500 mg by mouth every morning. 06/12/20   [provider]  omeprazole (PRILOSEC) 20 MG capsule Take 20 mg by mouth daily.    [provider]  ondansetron (ZOFRAN) 4 MG tablet Take 1 tablet (4 mg total) by mouth every 6 (six) hours as needed for nausea. 07/03/20   Lattie Corns, PA-C  predniSONE (DELTASONE) 10 MG tablet Take 2 tablets (20 mg total) by mouth 2 (two) times daily. 02/12/21   Verda Cumins, MD  traMADol (ULTRAM) 50 MG tablet Take 1 tablet (50 mg total) by mouth every 6 (six) hours as needed for moderate pain. 07/03/20   Lattie Corns, PA-C    Family History Family History  Problem Relation Age of Onset   Breast cancer Sister     Social History Social History   Tobacco Use   Smoking status: Never   Smokeless tobacco: Never  Vaping Use   Vaping Use: Never used  Substance Use Topics   Alcohol use: Never   Drug use: Never     Allergies   Oxycodone   Review of Systems Review of Systems  Constitutional:  Negative for activity change, appetite change, chills, diaphoresis, fatigue and fever.  HENT:  Negative for congestion, ear pain, postnasal drip, rhinorrhea, sinus pressure, sinus pain, sneezing, sore throat and trouble swallowing.   Eyes:  Negative for photophobia, pain, discharge, redness, itching and visual disturbance.       Mild periorbital swelling  Respiratory:  Negative for cough, chest tightness, shortness of  breath and wheezing.   Cardiovascular:  Negative for chest pain and palpitations.  Gastrointestinal:  Negative for abdominal pain, diarrhea, nausea and vomiting.  Genitourinary:  Negative for dysuria.  Musculoskeletal:  Negative for back pain, myalgias and neck pain.  Skin:  Negative for color change, pallor, rash and wound.  Neurological:  Negative for dizziness, light-headedness, numbness and headaches.  All other systems reviewed and are negative.   Physical Exam Triage Vital Signs ED Triage Vitals  Enc Vitals Group     BP 02/12/21 1756 128/72     Pulse Rate 02/12/21 1756 (!) 57  Resp 02/12/21 1756 14     Temp 02/12/21 1756 98.4 F (36.9 C)     Temp Source 02/12/21 1756 Oral     SpO2 02/12/21 1756 100 %     Weight 02/12/21 1752 200 lb (90.7 kg)     Height 02/12/21 1752 '5\' 2"'$  (1.575 m)     Head Circumference --      Peak Flow --      Pain Score 02/12/21 1751 7     Pain Loc --      Pain Edu? --      Excl. in [place]? --    No data found.  Updated Vital Signs BP 128/72 (BP Location: Right Arm)   Pulse (!) 57   Temp 98.4 F (36.9 C) (Oral)   Resp 14   Ht '5\' 2"'$  (1.575 m)   Wt 90.7 kg   SpO2 100%   BMI 36.58 kg/m   Visual Acuity Right Eye Distance:   Left Eye Distance:   Bilateral Distance:    Right Eye Near:   Left Eye Near:    Bilateral Near:     Physical Exam Vitals and nursing note reviewed.  Constitutional:      General: She is not in acute distress.    Appearance: Normal appearance. She is not ill-appearing, toxic-appearing or diaphoretic.  HENT:     Head: Normocephalic and atraumatic.     Nose: Nose normal. No congestion.     Mouth/Throat:     Mouth: Mucous membranes are moist.  Eyes:     General: No scleral icterus.       Right eye: No discharge.        Left eye: No discharge.     Extraocular Movements: Extraocular movements intact.     Right eye: Normal extraocular motion and no nystagmus.     Left eye: Normal extraocular motion and no  nystagmus.     Conjunctiva/sclera: Conjunctivae normal.     Pupils: Pupils are equal, round, and reactive to light.      Comments: Very mild infraorbital swelling.  Not overly tender to palpation.  Cardiovascular:     Rate and Rhythm: Normal rate and regular rhythm.     Pulses: Normal pulses.     Heart sounds: Normal heart sounds. No murmur heard.   No friction rub. No gallop.  Pulmonary:     Effort: Pulmonary effort is normal.     Breath sounds: Normal breath sounds. No stridor. No wheezing, rhonchi or rales.  Musculoskeletal:     Cervical back: Normal range of motion and neck supple.  Skin:    General: Skin is warm and dry.     Capillary Refill: Capillary refill takes less than 2 seconds.     Coloration: Skin is not jaundiced.     Comments: Mild reactive erythema under the right eye.  There is no warmth, evidence of infection, abscess formation or discharge.  Neurological:     General: No focal deficit present.     Mental Status: She is alert and oriented to person, place, and time.     UC Treatments / Results  Labs (all labs ordered are listed, but only abnormal results are displayed) Labs Reviewed - No data to display  EKG   Radiology No results found.  Procedures Procedures (including critical care time)  Medications Ordered in UC Medications  methylPREDNISolone sodium succinate (SOLU-MEDROL) 40 mg/mL injection 80 mg (80 mg Intramuscular Given 02/12/21 1846)    Initial Impression / Assessment  and Plan / UC Course  I have reviewed the triage vital signs and the nursing notes.  Pertinent labs & imaging results that were available during my care of the patient were reviewed by me and considered in my medical decision making (see chart for details).  Clinical impression: 1.  Accidental bee sting with a mild allergic reaction under the right eye. 2.  Swelling of the infraorbital area under the right eye.  Treatment plan: 1.  The findings and treatment plan were  discussed in detail with the patient.  Patient was in agreement. 2.  Given it so late in the day I recommended giving her 80 mg of IM Solu-Medrol.  Verbal consent was obtained and she tolerated this without incident. 3.  We will put her on steroids for 5 days and she will begin that tomorrow. 4.  Educational handouts provided. 5.  Counseled her that if she developed any wheezing, difficulty breathing, shortness of breath, chest pain or difficulty swallowing she should call 911 and go to the ER. 6.  I do want her to follow-up with her PCP next week and make sure that she is improving.  She will make that appointment on her own. 7.  If symptoms were to worsen she should go to the ER. 8.  She was discharged in stable condition will follow-up here as needed.    Final Clinical Impressions(s) / UC Diagnoses   Final diagnoses:  Bee sting reaction, accidental or unintentional, initial encounter  Swelling of right eye  Allergic reaction, initial encounter     Discharge Instructions      As we discussed, you are having an allergic reaction to the bee sting.  We did give you an injection of steroid to help this evening. I prescribed you oral steroids that you will take twice a day for the next 5 days. Please monitor your symptoms, if you are having any shortness of breath, wheezing, difficulty swallowing, or chest pain then please call 911 and go to the ER. Please see educational handouts. I would like you to follow-up with your primary care provider next week if your symptoms or not improving.     ED Prescriptions     Medication Sig Dispense Auth. Provider   predniSONE (DELTASONE) 10 MG tablet Take 2 tablets (20 mg total) by mouth 2 (two) times daily. 20 tablet Verda Cumins, MD      PDMP not reviewed this encounter.   Verda Cumins, MD 02/12/21 [phone]

## 2021-02-12 NOTE — Discharge Instructions (Addendum)
As we discussed, you are having an allergic reaction to the bee sting.  We did give you an injection of steroid to help this evening. I prescribed you oral steroids that you will take twice a day for the next 5 days. Please monitor your symptoms, if you are having any shortness of breath, wheezing, difficulty swallowing, or chest pain then please call 911 and go to the ER. Please see educational handouts. I would like you to follow-up with your primary care provider next week if your symptoms or not improving.

## 2021-09-20 ENCOUNTER — Other Ambulatory Visit: Payer: Self-pay | Admitting: Family

## 2021-09-20 DIAGNOSIS — Z1231 Encounter for screening mammogram for malignant neoplasm of breast: Secondary | ICD-10-CM

## 2022-01-17 ENCOUNTER — Ambulatory Visit
Admission: RE | Admit: 2022-01-17 | Discharge: 2022-01-17 | Disposition: A | Discharge status: Home / Self Care | Payer: Medicare PPO | Source: Ambulatory Visit | Attending: Family | Admitting: Family

## 2022-01-17 DIAGNOSIS — Z1231 Encounter for screening mammogram for malignant neoplasm of breast: Secondary | ICD-10-CM | POA: Insufficient documentation

## 2022-01-20 ENCOUNTER — Other Ambulatory Visit: Payer: Self-pay | Admitting: Family

## 2022-01-20 DIAGNOSIS — N6489 Other specified disorders of breast: Secondary | ICD-10-CM

## 2022-01-20 DIAGNOSIS — R928 Other abnormal and inconclusive findings on diagnostic imaging of breast: Secondary | ICD-10-CM

## 2022-02-01 ENCOUNTER — Other Ambulatory Visit: Payer: Medicare PPO

## 2022-02-01 ENCOUNTER — Inpatient Hospital Stay: Admission: RE | Admit: 2022-02-01 | Payer: Medicare PPO | Source: Ambulatory Visit

## 2022-02-10 ENCOUNTER — Ambulatory Visit
Admission: RE | Admit: 2022-02-10 | Discharge: 2022-02-10 | Disposition: A | Discharge status: Home / Self Care | Payer: Medicare PPO | Source: Ambulatory Visit | Attending: Family | Admitting: Family

## 2022-02-10 DIAGNOSIS — R928 Other abnormal and inconclusive findings on diagnostic imaging of breast: Secondary | ICD-10-CM | POA: Diagnosis present

## 2022-02-10 DIAGNOSIS — N6489 Other specified disorders of breast: Secondary | ICD-10-CM

## 2022-02-23 ENCOUNTER — Other Ambulatory Visit: Payer: Medicare PPO

## 2022-03-28 ENCOUNTER — Other Ambulatory Visit: Payer: Self-pay | Admitting: Orthopedic Surgery

## 2022-03-28 DIAGNOSIS — M4807 Spinal stenosis, lumbosacral region: Secondary | ICD-10-CM

## 2022-03-28 DIAGNOSIS — M5136 Other intervertebral disc degeneration, lumbar region: Secondary | ICD-10-CM

## 2022-03-28 DIAGNOSIS — G8929 Other chronic pain: Secondary | ICD-10-CM

## 2022-04-11 ENCOUNTER — Ambulatory Visit
Admission: RE | Admit: 2022-04-11 | Discharge: 2022-04-11 | Disposition: A | Discharge status: Home / Self Care | Payer: Medicare PPO | Source: Ambulatory Visit | Attending: Orthopedic Surgery | Admitting: Orthopedic Surgery

## 2022-04-11 DIAGNOSIS — M5136 Other intervertebral disc degeneration, lumbar region: Secondary | ICD-10-CM

## 2022-04-11 DIAGNOSIS — M4807 Spinal stenosis, lumbosacral region: Secondary | ICD-10-CM

## 2022-04-11 DIAGNOSIS — G8929 Other chronic pain: Secondary | ICD-10-CM

## 2022-09-02 IMAGING — MG MM DIGITAL SCREENING BILAT W/ TOMO AND CAD
8 series · 8 of 24 positions shown · non-contrast
Comparison: Previous exam(s).

CLINICAL DATA: Screening.

EXAM:
DIGITAL SCREENING BILATERAL MAMMOGRAM WITH TOMOSYNTHESIS AND CAD
TECHNIQUE: Bilateral screening digital craniocaudal and mediolateral oblique
mammograms were obtained. Bilateral screening digital breast
tomosynthesis was performed. The images were evaluated with
computer-aided detection.

[R CC synth-2D]
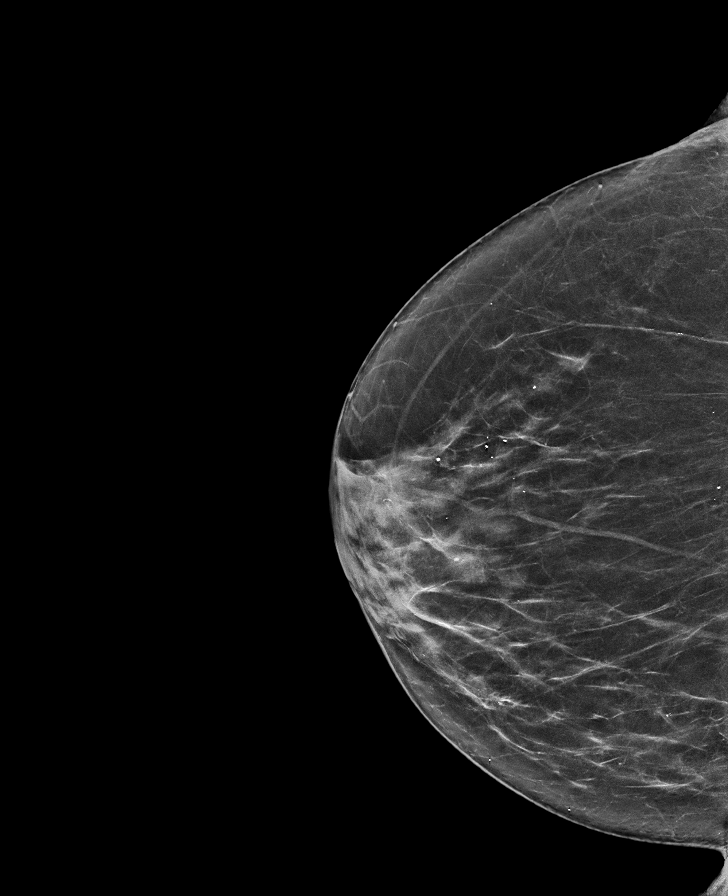

[R MLO synth-2D]
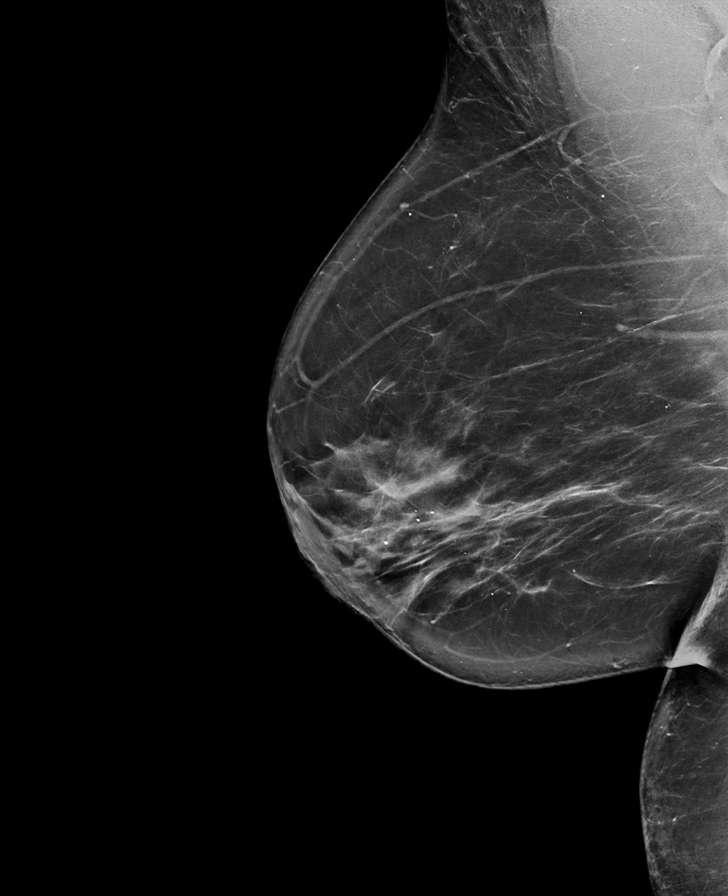

[L CC synth-2D]
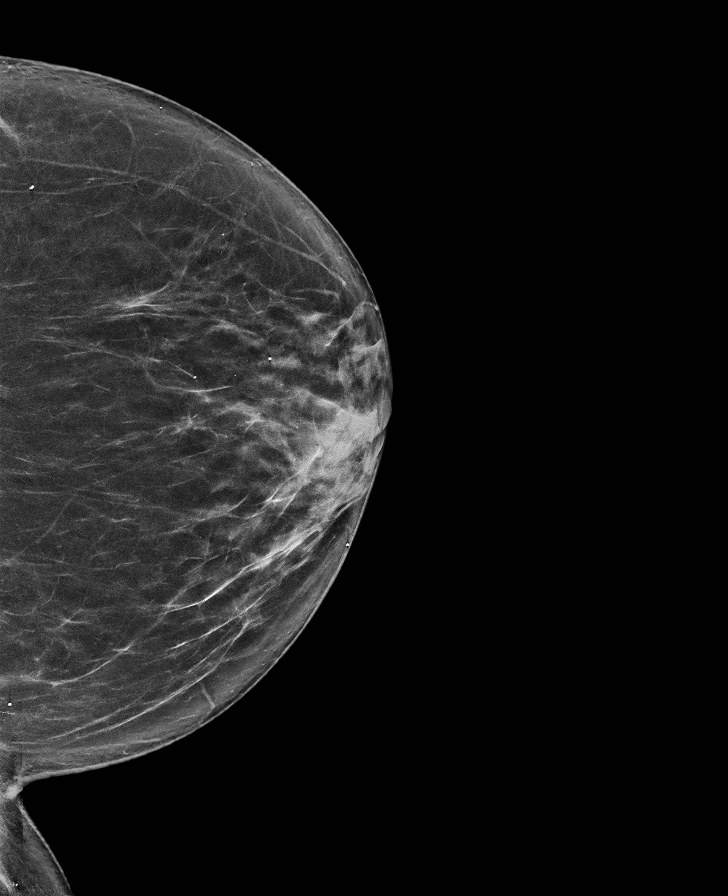

[L MLO synth-2D]
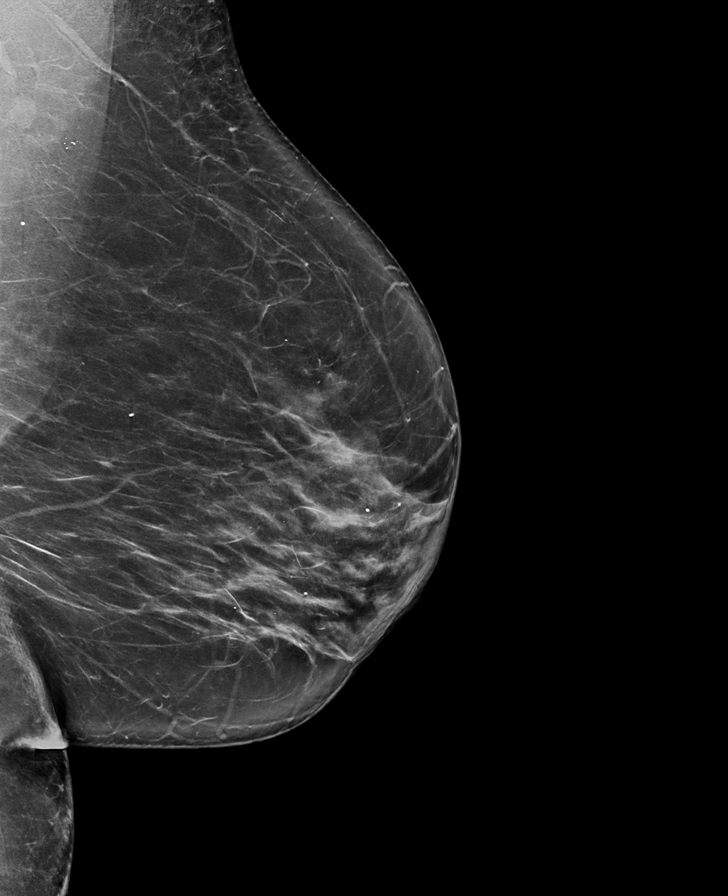

[R CC tomo · tomo slice 37/72.0]
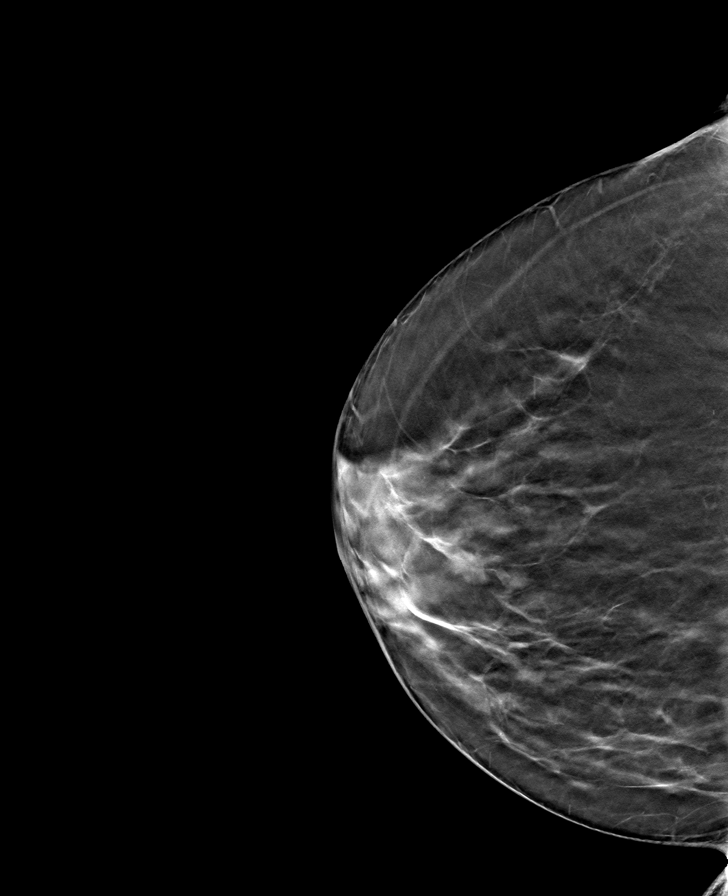

[R MLO tomo · tomo slice 45/88.0]
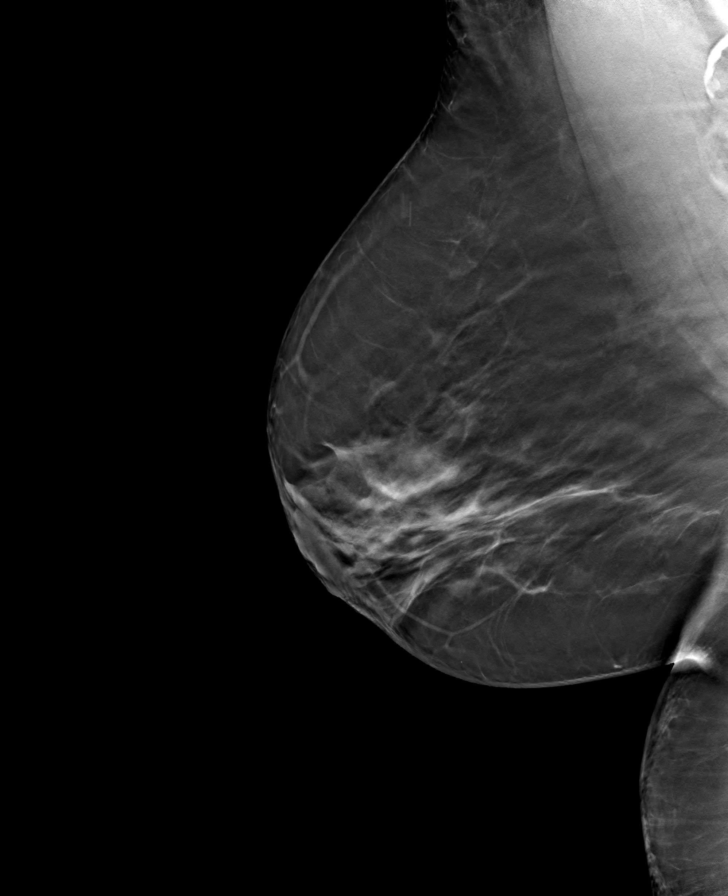

[L MLO tomo · tomo slice 43/85.0]
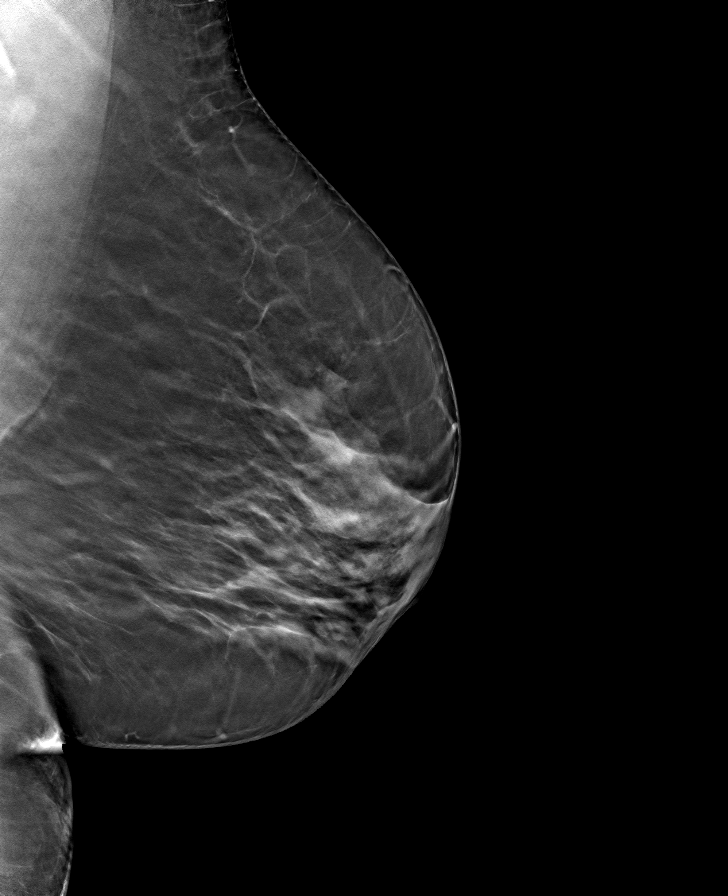

[L CC tomo · tomo slice 39/77.0]
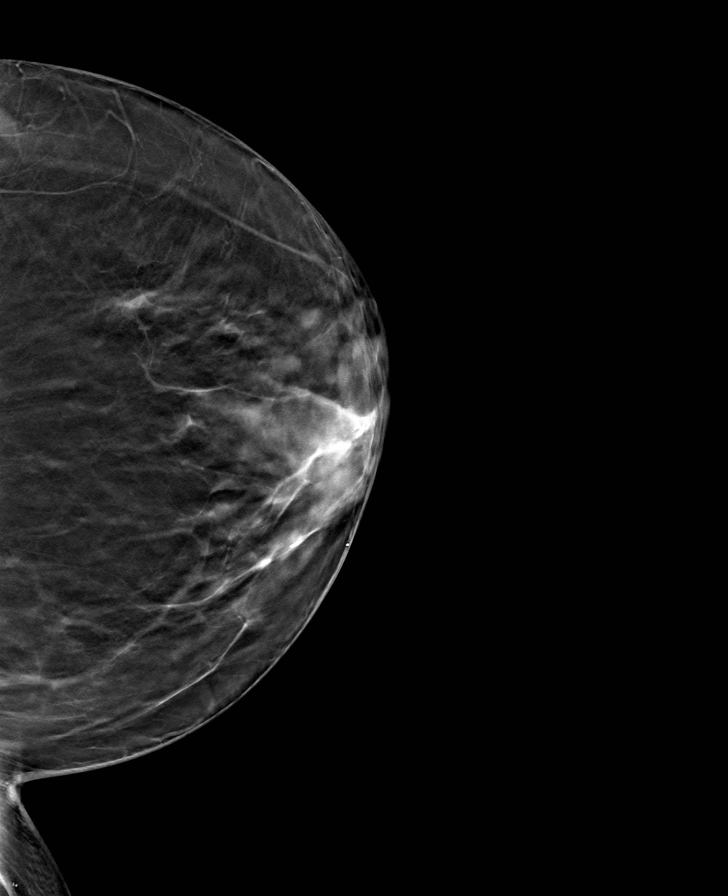

[8 of 24 positions shown; findings below may reference images not displayed]

ACR Breast Density Category b: There are scattered areas of
fibroglandular density.
FINDINGS: There are no findings suspicious for malignancy.
IMPRESSION: No mammographic evidence of malignancy. A result letter of this
screening mammogram will be mailed directly to the patient.

RECOMMENDATION:
Screening mammogram in one year. (Code:51-O-LD2)

BI-RADS CATEGORY  1: Negative.

## 2023-01-04 ENCOUNTER — Other Ambulatory Visit: Payer: Self-pay | Admitting: Family

## 2023-01-04 DIAGNOSIS — Z1231 Encounter for screening mammogram for malignant neoplasm of breast: Secondary | ICD-10-CM

## 2023-05-21 ENCOUNTER — Other Ambulatory Visit: Payer: Self-pay

## 2023-05-21 ENCOUNTER — Emergency Department: Payer: Medicare PPO

## 2023-05-21 ENCOUNTER — Emergency Department
Admission: EM | Admit: 2023-05-21 | Discharge: 2023-05-21 | Disposition: A | Discharge status: Home / Self Care | Payer: Medicare PPO | Attending: Emergency Medicine | Admitting: Emergency Medicine

## 2023-05-21 DIAGNOSIS — M542 Cervicalgia: Secondary | ICD-10-CM | POA: Diagnosis present

## 2023-05-21 DIAGNOSIS — I10 Essential (primary) hypertension: Secondary | ICD-10-CM | POA: Insufficient documentation

## 2023-05-21 DIAGNOSIS — M436 Torticollis: Secondary | ICD-10-CM | POA: Diagnosis not present

## 2023-05-21 DIAGNOSIS — J45909 Unspecified asthma, uncomplicated: Secondary | ICD-10-CM | POA: Diagnosis not present

## 2023-05-21 LAB — URINALYSIS, ROUTINE W REFLEX MICROSCOPIC
Bacteria, UA: NONE SEEN
Bilirubin Urine: NEGATIVE
Glucose, UA: NEGATIVE mg/dL
Hgb urine dipstick: NEGATIVE
Ketones, ur: NEGATIVE mg/dL
Nitrite: NEGATIVE
Protein, ur: NEGATIVE mg/dL
Specific Gravity, Urine: 1.015 (ref 1.005–1.030)
pH: 7 (ref 5.0–8.0)

## 2023-05-21 MED ORDER — DIAZEPAM 2 MG PO TABS
2.0000 mg | ORAL_TABLET | Freq: Once | ORAL | Status: AC
  Administered 2023-05-21: 2 mg via ORAL
  Filled 2023-05-21: qty 1

## 2023-05-21 MED ORDER — DIAZEPAM 2 MG PO TABS: 2.0000 mg | ORAL_TABLET | Freq: Three times a day (TID) | ORAL | 0 refills | Status: AC | PRN

## 2023-05-21 MED ORDER — LIDOCAINE 5 % EX PTCH
1.0000 | MEDICATED_PATCH | CUTANEOUS | Status: DC
  Administered 2023-05-21: 1 via TRANSDERMAL
  Filled 2023-05-21: qty 1

## 2023-05-21 NOTE — Discharge Instructions (Signed)
Follow-up with your primary care provider if any continued problems or concerns.  A prescription for diazepam 2 mg was sent to the pharmacy for you to take every 8 hours as needed for muscle spasms.  Be aware that this medication could cause drowsiness and increase your risk for falling.  You may use ice or heat to your neck as needed for discomfort.  You may also continue taking Tylenol or naproxen as you have been doing.  Using the Lidoderm patches would be fine to use in this area also to help control pain.

## 2023-05-21 NOTE — ED Triage Notes (Addendum)
Pt states coming in with left sided stiff neck for 3 days and an increase in urinary frequency. Pt denies burning with urination.  Pt states HTN with last dose of metoprolol this AM.

## 2023-05-21 NOTE — ED Provider Notes (Signed)
[facility] Provider Note    Event Date/Time   First MD Initiated Contact with Patient 05/21/23 1203     (approximate)   History   Torticollis   HPI  Hannah Davila is a 77 y.o. female   presents to the ED with complaint of left-sided neck stiffness for the last 3 days.  Patient states that she woke up like this.  She denies any injury to her neck or previous neck problems.  She denies any radiation into her left upper extremity, chest pain, shortness of breath, difficulty breathing, diaphoresis, nausea or vomiting.  Patient has been taking Tylenol and naproxen without any relief of her neck pain.  She also reports some dysuria but denies frequency.  Patient has a history of hypertension, asthma, GERD and degenerative arthritis.      Physical Exam   Triage Vital Signs: ED Triage Vitals  Encounter Vitals Group     BP 05/21/23 1146 (!) 177/83     Systolic BP Percentile --      Diastolic BP Percentile --      Pulse Rate 05/21/23 1146 72     Resp 05/21/23 1146 18     Temp 05/21/23 1146 98.1 F (36.7 C)     Temp Source 05/21/23 1146 Oral     SpO2 05/21/23 1146 99 %     Weight 05/21/23 1144 204 lb (92.5 kg)     Height 05/21/23 1144 5\' 3"  (1.6 m)     Head Circumference --      Peak Flow --      Pain Score 05/21/23 1144 6     Pain Loc --      Pain Education --      Exclude from Growth Chart --     Most recent vital signs: Vitals:   05/21/23 1146 05/21/23 1438  BP: (!) 177/83 (!) 163/83  Pulse: 72   Resp: 18 12  Temp: 98.1 F (36.7 C) 97.6 F (36.4 C)  SpO2: 99% 100%     General: Awake, no distress.  CV:  Good peripheral perfusion.  Heart regular rate and rhythm. Resp:  Normal effort.  Lungs are clear bilaterally. Abd:  No distention.  Other:  No point tenderness on palpation of cervical spine posteriorly.  There is moderate tenderness on palpation of the left trapezius muscle.  Range of motion is slow and guarded secondary to  discomfort and increased pain to the trapezius area.  Patient with little difficulty with rotation to the right shoulder.  Patient is able to flex and extend without restriction.   ED Results / Procedures / Treatments   Labs (all labs ordered are listed, but only abnormal results are displayed) Labs Reviewed  URINALYSIS, ROUTINE W REFLEX MICROSCOPIC - Abnormal; Notable for the following components:      Result Value   Color, Urine YELLOW (*)    APPearance CLEAR (*)    Leukocytes,Ua TRACE (*)    All other components within normal limits      RADIOLOGY Cervical spine x-ray in's reviewed and interpreted by myself independent of the radiologist with degenerative changes noted multiple levels.  Official radiology report also mentions interval disc height changes C5-C6 and C6 -T1.    PROCEDURES:  Critical Care performed:   Procedures   MEDICATIONS ORDERED IN ED: Medications  lidocaine (LIDODERM) 5 % 1 patch (1 patch Transdermal Patch Applied 05/21/23 1326)  diazepam (VALIUM) tablet 2 mg (2 mg Oral Given 05/21/23 1254)  IMPRESSION / MDM / ASSESSMENT AND PLAN / ED COURSE  I reviewed the triage vital signs and the nursing notes.   Differential diagnosis includes, but is not limited to, cervical pain, cervical strain, torticollis, musculoskeletal strain, degenerative disc disease.  77 year old female presents to the ED with complaint of left sided cervical pain that started approximately 2 to 3 days ago without relief of over-the-counter medications.  Patient denies any recent injury.  X-rays did show degenerative changes at multiple levels.  Prior to patient going to x-ray she was given diazepam 2 mg p.o. and a Lidoderm patch to the area once she returned.  Patient states that she is getting relief of her pain and is feeling some better.  We discussed the fact that the diazepam is a mild muscle relaxant but could cause drowsiness.  She is cautioned about taking the medication and  knows that she cannot drive while taking the medication and caution when up walking as this could cause increased risk for falling.  She already has Lidoderm patches at home that she can use.  Ice or heat to her shoulder as needed for discomfort.  She is to follow-up with her PCP if any continued problems.      Patient's presentation is most consistent with acute illness / injury with system symptoms.  FINAL CLINICAL IMPRESSION(S) / ED DIAGNOSES   Final diagnoses:  Torticollis, acute     Rx / DC Orders   ED Discharge Orders          Ordered    diazepam (VALIUM) 2 MG tablet  Every 8 hours PRN        05/21/23 1424             Note:  This document was prepared using Dragon voice recognition software and may include unintentional dictation errors.   Tommi Rumps, PA-C 05/21/23 1503    Minna Antis, MD 05/22/23 1512
